# Patient Record
Sex: Female | Born: 2000 | Race: Black or African American | Hispanic: No | Marital: Single | Smoking: Never smoker
Health system: Southern US, Community
[De-identification: ages and names within clinical notes are randomized; demographics above are authoritative.]

## PROBLEM LIST (undated history)

## (undated) ENCOUNTER — Emergency Department (HOSPITAL_COMMUNITY): Admission: EM | Payer: Medicaid Other | Source: Home / Self Care

## (undated) DIAGNOSIS — F909 Attention-deficit hyperactivity disorder, unspecified type: Secondary | ICD-10-CM

## (undated) DIAGNOSIS — A6009 Herpesviral infection of other urogenital tract: Secondary | ICD-10-CM

## (undated) DIAGNOSIS — J302 Other seasonal allergic rhinitis: Secondary | ICD-10-CM

## (undated) HISTORY — DX: Herpesviral infection of other urogenital tract: A60.09

## (undated) HISTORY — PX: NO PAST SURGERIES: SHX2092

---
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
## 2020-10-03 ENCOUNTER — Other Ambulatory Visit: Payer: Self-pay

## 2020-10-03 ENCOUNTER — Encounter (HOSPITAL_COMMUNITY): Payer: Self-pay | Admitting: Emergency Medicine

## 2020-10-03 ENCOUNTER — Emergency Department (HOSPITAL_COMMUNITY)
Admission: EM | Admit: 2020-10-03 | Discharge: 2020-10-03 | Disposition: A | Discharge status: Home / Self Care | Payer: Medicaid Other | Attending: Emergency Medicine | Admitting: Emergency Medicine

## 2020-10-03 ENCOUNTER — Emergency Department (HOSPITAL_COMMUNITY): Payer: Medicaid Other

## 2020-10-03 DIAGNOSIS — Z3A01 Less than 8 weeks gestation of pregnancy: Secondary | ICD-10-CM | POA: Diagnosis not present

## 2020-10-03 DIAGNOSIS — O200 Threatened abortion: Secondary | ICD-10-CM | POA: Insufficient documentation

## 2020-10-03 DIAGNOSIS — O26891 Other specified pregnancy related conditions, first trimester: Secondary | ICD-10-CM | POA: Diagnosis present

## 2020-10-03 LAB — COMPREHENSIVE METABOLIC PANEL
ALT: 15 U/L (ref 0–44)
AST: 17 U/L (ref 15–41)
Albumin: 4.1 g/dL (ref 3.5–5.0)
Alkaline Phosphatase: 44 U/L (ref 38–126)
Anion gap: 9 (ref 5–15)
BUN: 10 mg/dL (ref 6–20)
CO2: 25 mmol/L (ref 22–32)
Calcium: 9.3 mg/dL (ref 8.9–10.3)
Chloride: 100 mmol/L (ref 98–111)
Creatinine, Ser: 0.59 mg/dL (ref 0.44–1.00)
GFR, Estimated: >60 mL/min (ref >60)
Glucose, Bld: 97 mg/dL (ref 70–99)
Potassium: 3.6 mmol/L (ref 3.5–5.1)
Sodium: 134 mmol/L — ABNORMAL LOW (ref 135–145)
Total Bilirubin: 0.5 mg/dL (ref 0.3–1.2)
Total Protein: 7.2 g/dL (ref 6.5–8.1)

## 2020-10-03 LAB — URINALYSIS, ROUTINE W REFLEX MICROSCOPIC
Bilirubin Urine: NEGATIVE
Glucose, UA: NEGATIVE mg/dL
Hgb urine dipstick: NEGATIVE
Ketones, ur: NEGATIVE mg/dL
Nitrite: NEGATIVE
Protein, ur: NEGATIVE mg/dL
Specific Gravity, Urine: 1.014 (ref 1.005–1.030)
pH: 6 (ref 5.0–8.0)

## 2020-10-03 LAB — CBC
HCT: 37.9 % (ref 36.0–46.0)
Hemoglobin: 12.6 g/dL (ref 12.0–15.0)
MCH: 29.4 pg (ref 26.0–34.0)
MCHC: 33.2 g/dL (ref 30.0–36.0)
MCV: 88.3 fL (ref 80.0–100.0)
Platelets: 289 10*3/uL (ref 150–400)
RBC: 4.29 MIL/uL (ref 3.87–5.11)
RDW: 13.2 % (ref 11.5–15.5)
WBC: 7.6 10*3/uL (ref 4.0–10.5)
nRBC: 0 % (ref 0.0–0.2)

## 2020-10-03 LAB — HCG, QUANTITATIVE, PREGNANCY: hCG, Beta Chain, Quant, S: 4715 m[IU]/mL — ABNORMAL HIGH (ref <5)

## 2020-10-03 LAB — LIPASE, BLOOD: Lipase: 32 U/L (ref 11–51)

## 2020-10-03 MED ORDER — PRENATAL VITAMINS 28-0.8 MG PO TABS: 1.0000 | ORAL_TABLET | Freq: Every day | ORAL | 0 refills | Status: DC

## 2020-10-03 NOTE — ED Provider Notes (Signed)
[facility]-EMERGENCY DEPT Provider Note: Lowella Dell, MD, FACEP  CSN: [id number] MRN: [id number] ARRIVAL: 10/03/20 at 0124 ROOM: WA15/[facility]15   CHIEF COMPLAINT  Abdominal Pain (pregnant)   HISTORY OF PRESENT ILLNESS  10/03/20 3:54 AM Andrea Vaughn is a 19 y.o. female who is G1, P0 of undetermined gestation.  She is here with abdominal cramping that began about an hour prior to arrival.  She rates the cramping is a 4 out of 10 in the upper abdomen.  Nothing makes it better or worse.  It has subsequently improved.  She denies vaginal bleeding.    Past Medical History:  Diagnosis Date  . ADHD (attention deficit hyperactivity disorder)   . Seasonal allergies     History reviewed. No pertinent surgical history.  No family history on file.  Social History   Tobacco Use  . Smoking status: Never Smoker  . Smokeless tobacco: Never Used  Substance Use Topics  . Alcohol use: Never  . Drug use: Never    Prior to Admission medications   Medication Sig Start Date End Date Taking? Authorizing Provider  Prenatal Vit-Fe Fumarate-FA (PRENATAL VITAMINS) 28-0.8 MG TABS Take 1 tablet by mouth daily. 10/03/20   Jef Futch, MD  cetirizine (ZYRTEC) 10 MG tablet Take 10 mg by mouth daily as needed for allergies.  10/03/20  [provider]  methylphenidate (METADATE CD) 10 MG CR capsule Take 10 mg by mouth every morning.  10/03/20  [provider]    Allergies Patient has no known allergies.   REVIEW OF SYSTEMS  Negative except as noted here or in the History of Present Illness.   PHYSICAL EXAMINATION  Initial Vital Signs Blood pressure (!) 116/96, pulse 94, resp. rate 18, SpO2 100 %.  Examination General: Well-developed, well-nourished female in no acute distress; appearance consistent with age of record HENT: normocephalic; atraumatic Eyes: pupils equal, round and reactive to light; extraocular muscles intact Neck: supple Heart: regular rate and rhythm Lungs:  clear to auscultation bilaterally Abdomen: soft; nondistended; nontender; no masses or hepatosplenomegaly; bowel sounds present Extremities: No deformity; full range of motion; pulses normal Neurologic: Awake, alert and oriented; motor function intact in all extremities and symmetric; no facial droop Skin: Warm and dry Psychiatric: Normal mood and affect   RESULTS  Summary of this visit's results, reviewed and interpreted by myself:   EKG Interpretation  Date/Time:    Ventricular Rate:    PR Interval:    QRS Duration:   QT Interval:    QTC Calculation:   R Axis:     Text Interpretation:        Laboratory Studies: Results for orders placed or performed during the hospital encounter of 10/03/20 (from the past 24 hour(s))  Lipase, blood     Status: None   Collection Time: 10/03/20  1:59 AM  Result Value Ref Range   Lipase 32 11 - 51 U/L  Comprehensive metabolic panel     Status: Abnormal   Collection Time: 10/03/20  1:59 AM  Result Value Ref Range   Sodium 134 (L) 135 - 145 mmol/L   Potassium 3.6 3.5 - 5.1 mmol/L   Chloride 100 98 - 111 mmol/L   CO2 25 22 - 32 mmol/L   Glucose, Bld 97 70 - 99 mg/dL   BUN 10 6 - 20 mg/dL   Creatinine, Ser 2.59 0.44 - 1.00 mg/dL   Calcium 9.3 8.9 - 56.3 mg/dL   Total Protein 7.2 6.5 - 8.1 g/dL   Albumin  4.1 3.5 - 5.0 g/dL   AST 17 15 - 41 U/L   ALT 15 0 - 44 U/L   Alkaline Phosphatase 44 38 - 126 U/L   Total Bilirubin 0.5 0.3 - 1.2 mg/dL   GFR, Estimated >56 >21 mL/min   Anion gap 9 5 - 15  CBC     Status: None   Collection Time: 10/03/20  1:59 AM  Result Value Ref Range   WBC 7.6 4.0 - 10.5 K/uL   RBC 4.29 3.87 - 5.11 MIL/uL   Hemoglobin 12.6 12.0 - 15.0 g/dL   HCT 30.8 36 - 46 %   MCV 88.3 80.0 - 100.0 fL   MCH 29.4 26.0 - 34.0 pg   MCHC 33.2 30.0 - 36.0 g/dL   RDW 65.7 84.6 - 96.2 %   Platelets 289 150 - 400 K/uL   nRBC 0.0 0.0 - 0.2 %  hCG, quantitative, pregnancy     Status: Abnormal   Collection Time: 10/03/20  2:10 AM    Result Value Ref Range   hCG, Beta Chain, Quant, S 4,715 (H) <5 mIU/mL  Urinalysis, Routine w reflex microscopic     Status: Abnormal   Collection Time: 10/03/20  3:58 AM  Result Value Ref Range   Color, Urine YELLOW YELLOW   APPearance CLOUDY (A) CLEAR   Specific Gravity, Urine 1.014 1.005 - 1.030   pH 6.0 5.0 - 8.0   Glucose, UA NEGATIVE NEGATIVE mg/dL   Hgb urine dipstick NEGATIVE NEGATIVE   Bilirubin Urine NEGATIVE NEGATIVE   Ketones, ur NEGATIVE NEGATIVE mg/dL   Protein, ur NEGATIVE NEGATIVE mg/dL   Nitrite NEGATIVE NEGATIVE   Leukocytes,Ua TRACE (A) NEGATIVE   RBC / HPF 0-5 0 - 5 RBC/hpf   WBC, UA 0-5 0 - 5 WBC/hpf   Bacteria, UA RARE (A) NONE SEEN   Squamous Epithelial / LPF 11-20 0 - 5   Mucus PRESENT    Imaging Studies: US OB Comp Less 14 Wks  Result Date: 10/03/2020 CLINICAL DATA:  Initial evaluation for acute abdominal pain, early pregnancy. EXAM: OBSTETRIC <14 WK ULTRASOUND TECHNIQUE: Transabdominal ultrasound was performed for evaluation of the gestation as well as the maternal uterus and adnexal regions. COMPARISON:  None. FINDINGS: Intrauterine gestational sac: Single Yolk sac:  Negative. Embryo:  Negative. Cardiac Activity: Negative. Heart Rate: N/A MSD:  9.6 mm   5 w   5 d Subchorionic hemorrhage:  None visualized. Maternal uterus/adnexae: Ovaries within normal limits bilaterally. Small corpus luteal cyst noted on the right. No adnexal mass or free fluid. IMPRESSION: 1. Probable early intrauterine gestational sac, but no yolk sac, fetal pole, or cardiac activity yet visualized. Recommend follow-up quantitative B-HCG levels and follow-up US in 14 days to confirm and assess viability. This recommendation follows SRU consensus guidelines: Diagnostic Criteria for Nonviable Pregnancy Early in the First Trimester. Malva Limes Med 2013; 952:8413-24. 2. No other acute maternal uterine or adnexal abnormality identified. Electronically Signed   By: Rise Mu M.D.   On:  10/03/2020 05:08    ED COURSE and MDM  Nursing notes, initial and subsequent vitals signs, including pulse oximetry, reviewed and interpreted by myself.  Vitals:   10/03/20 0349  BP: (!) 116/96  Pulse: 94  Resp: 18  SpO2: 100%   Medications - No data to display  We will refer patient to women's clinic.  PROCEDURES  Procedures   ED DIAGNOSES     ICD-10-CM   1. Threatened miscarriage in early pregnancy  O20.0        Marlaysia Lenig, Jonny Ruiz, MD 10/03/20 [phone]

## 2020-10-03 NOTE — ED Triage Notes (Signed)
Patient here from home reporting abd cramping that started 1 hour ago. Pregnant, not sure how far along. No bleeding. 1st baby.

## 2020-10-04 LAB — ABO/RH: ABO/RH(D): AB POS

## 2020-10-12 ENCOUNTER — Emergency Department (HOSPITAL_COMMUNITY)
Admission: EM | Admit: 2020-10-12 | Discharge: 2020-10-13 | Disposition: A | Discharge status: Home / Self Care | Payer: Medicaid Other | Attending: Emergency Medicine | Admitting: Emergency Medicine

## 2020-10-12 ENCOUNTER — Emergency Department (HOSPITAL_COMMUNITY): Payer: Medicaid Other

## 2020-10-12 ENCOUNTER — Other Ambulatory Visit: Payer: Self-pay

## 2020-10-12 ENCOUNTER — Encounter (HOSPITAL_COMMUNITY): Payer: Self-pay

## 2020-10-12 DIAGNOSIS — O4690 Antepartum hemorrhage, unspecified, unspecified trimester: Secondary | ICD-10-CM

## 2020-10-12 DIAGNOSIS — O039 Complete or unspecified spontaneous abortion without complication: Secondary | ICD-10-CM | POA: Insufficient documentation

## 2020-10-12 DIAGNOSIS — O26859 Spotting complicating pregnancy, unspecified trimester: Secondary | ICD-10-CM | POA: Diagnosis present

## 2020-10-12 LAB — HCG, QUANTITATIVE, PREGNANCY: hCG, Beta Chain, Quant, S: 6089 m[IU]/mL — ABNORMAL HIGH (ref <5)

## 2020-10-12 NOTE — ED Provider Notes (Addendum)
[facility]EM[facility] DEPT Provider Note   CSN: [id number] Arrival date & time: 10/12/20  1815     History Chief Complaint  Patient presents with  . Vaginal Bleeding    Andrea Vaughn is a 19 y.o. female.  The history is provided by the patient. No language interpreter was used.  Vaginal Bleeding Quality:  Spotting Severity:  Mild Timing:  Constant Progression:  Worsening Chronicity:  New Possible pregnancy: yes   Relieved by:  Nothing Worsened by:  Nothing Ineffective treatments:  None tried Associated symptoms: no dizziness        Past Medical History:  Diagnosis Date  . ADHD (attention deficit hyperactivity disorder)   . Seasonal allergies     There are no problems to display for this patient.   History reviewed. No pertinent surgical history.   OB History    Gravida  1   Para      Term      Preterm      AB      Living        SAB      TAB      Ectopic      Multiple      Live Births              History reviewed. No pertinent family history.  Social History   Tobacco Use  . Smoking status: Never Smoker  . Smokeless tobacco: Never Used  Vaping Use  . Vaping Use: Never used  Substance Use Topics  . Alcohol use: Never  . Drug use: Never    Home Medications Prior to Admission medications   Medication Sig Start Date End Date Taking? Authorizing Provider  Prenatal Vit-Fe Fumarate-FA (PRENATAL VITAMINS) 28-0.8 MG TABS Take 1 tablet by mouth daily. 10/03/20  Yes Molpus, John, MD  cetirizine (ZYRTEC) 10 MG tablet Take 10 mg by mouth daily as needed for allergies.  10/03/20  [provider]  methylphenidate (METADATE CD) 10 MG CR capsule Take 10 mg by mouth every morning.  10/03/20  [provider]    Allergies    Penicillins  Review of Systems   Review of Systems  Genitourinary: Positive for vaginal bleeding.  Neurological: Negative for dizziness.  All other systems reviewed and are  negative.   Physical Exam Updated Vital Signs BP 96/71   Pulse 82   Temp 98.8 F (37.1 C) (Oral)   Resp 18   Ht 5\' 2"  (1.575 m)   Wt 54.4 kg   SpO2 100%   BMI 21.95 kg/m   Physical Exam Vitals and nursing note reviewed.  Constitutional:      Appearance: She is well-developed.  HENT:     Head: Normocephalic.  Cardiovascular:     Rate and Rhythm: Normal rate.  Pulmonary:     Effort: Pulmonary effort is normal.  Abdominal:     General: There is no distension.     Tenderness: There is abdominal tenderness.  Genitourinary:    Comments: Small amount of red blood os closed Musculoskeletal:        General: Normal range of motion.     Cervical back: Normal range of motion.  Skin:    General: Skin is warm.  Neurological:     General: No focal deficit present.     Mental Status: She is alert and oriented to person, place, and time.  Psychiatric:        Mood and Affect: Mood normal.  ED Results / Procedures / Treatments   Labs (all labs ordered are listed, but only abnormal results are displayed) Labs Reviewed  HCG, QUANTITATIVE, PREGNANCY - Abnormal; Notable for the following components:      Result Value   hCG, Beta Chain, Quant, S 6,089 (*)    All other components within normal limits    EKG None  Radiology US OB Transvaginal  Result Date: 10/12/2020 CLINICAL DATA:  Bleeding, quantitative hCG 6089 EXAM: OBSTETRIC <14 WK ULTRASOUND TECHNIQUE: Transabdominal ultrasound was performed for evaluation of the gestation as well as the maternal uterus and adnexal regions. COMPARISON:  Ultrasound 10/03/2020 FINDINGS: Intrauterine gestational sac: Single Yolk sac:  Not Visualized. Embryo:  Not Visualized. Cardiac: Not Visualized. MSD:  10.9 mm   5 w   6 d Subchorionic hemorrhage: Small volume subchorionic hemorrhage of the presumed implantation site (5/41). Maternal uterus/adnexae: The anteverted maternal uterus is otherwise unremarkable. No concerning adnexal lesions.  Trace free fluid in the pelvis is nonspecific and may be physiologic. IMPRESSION: Gestational sac with absence of a discernible fetal pole or cardiac activity 9 days after prior study demonstrating a probable gestational sac. Small volume subchorionic hemorrhage is also now seen adjacent the implantation site. Findings are suspicious but not yet definitive for failed pregnancy. Recommend follow-up US in 10-14 days for definitive diagnosis. This recommendation follows SRU consensus guidelines: Diagnostic Criteria for Nonviable Pregnancy Early in the First Trimester. Malva Limes Med 2013; 161:0960-45. Electronically Signed   By: Kreg Shropshire M.D.   On: 10/12/2020 23:39    Procedures Procedures (including critical care time)  Medications Ordered in ED Medications - No data to display  ED Course  I have reviewed the triage vital signs and the nursing notes.  Pertinent labs & imaging results that were available during my care of the patient were reviewed by me and considered in my medical decision making (see chart for details).    MDM Rules/Calculators/A&P                          MDM:  Pt advised to schedule to see OB/gyn for recheck.   Final Clinical Impression(s) / ED Diagnoses Final diagnoses:  Vaginal bleeding in pregnancy    Rx / DC Orders ED Discharge Orders    None    An After Visit Summary was printed and given to the patient.    Elson Areas, PA-C 10/12/20 [id number]    Elson Areas, PA-C 10/13/20 [name]    Arby Barrette, MD 10/23/20 [phone]

## 2020-10-12 NOTE — ED Triage Notes (Signed)
Patient reports that she had a small amount of pink drainage today and states she is pregnant. Patient has not gone for an OB/GYN appointment.  patient also c/o lower abdominal cramping. Patient states she was here 9 days ago and had abdominal cramping then .

## 2020-10-12 NOTE — ED Notes (Signed)
Pt states she is pregnant , has a pink colored discharge, lower abd. Pain and is pregnant

## 2020-10-12 NOTE — Discharge Instructions (Addendum)
Schedule evaluation with obgyn.  Your pregnancy does not appear to be progressing normally.  Go to [facility] unit at [facility] if symptoms worsen or change

## 2020-10-13 ENCOUNTER — Telehealth: Payer: Self-pay | Admitting: Advanced Practice Midwife

## 2020-10-13 ENCOUNTER — Inpatient Hospital Stay (HOSPITAL_COMMUNITY): Payer: Medicaid Other

## 2020-10-13 ENCOUNTER — Telehealth: Payer: Self-pay

## 2020-10-13 ENCOUNTER — Encounter (HOSPITAL_COMMUNITY): Payer: Self-pay | Admitting: Obstetrics & Gynecology

## 2020-10-13 ENCOUNTER — Inpatient Hospital Stay (EMERGENCY_DEPARTMENT_HOSPITAL)
Admission: AD | Admit: 2020-10-13 | Discharge: 2020-10-13 | Disposition: A | Discharge status: Home / Self Care | Payer: Medicaid Other | Source: Home / Self Care | Attending: Obstetrics & Gynecology | Admitting: Obstetrics & Gynecology

## 2020-10-13 DIAGNOSIS — O4690 Antepartum hemorrhage, unspecified, unspecified trimester: Secondary | ICD-10-CM

## 2020-10-13 DIAGNOSIS — O039 Complete or unspecified spontaneous abortion without complication: Secondary | ICD-10-CM | POA: Insufficient documentation

## 2020-10-13 DIAGNOSIS — O209 Hemorrhage in early pregnancy, unspecified: Secondary | ICD-10-CM

## 2020-10-13 DIAGNOSIS — Z3A01 Less than 8 weeks gestation of pregnancy: Secondary | ICD-10-CM | POA: Insufficient documentation

## 2020-10-13 LAB — CBC
HCT: 39 % (ref 36.0–46.0)
Hemoglobin: 12.8 g/dL (ref 12.0–15.0)
MCH: 28.4 pg (ref 26.0–34.0)
MCHC: 32.8 g/dL (ref 30.0–36.0)
MCV: 86.7 fL (ref 80.0–100.0)
Platelets: 297 10*3/uL (ref 150–400)
RBC: 4.5 MIL/uL (ref 3.87–5.11)
RDW: 12.9 % (ref 11.5–15.5)
WBC: 4.2 10*3/uL (ref 4.0–10.5)
nRBC: 0 % (ref 0.0–0.2)

## 2020-10-13 LAB — COMPREHENSIVE METABOLIC PANEL
ALT: 19 U/L (ref 0–44)
AST: 22 U/L (ref 15–41)
Albumin: 3.9 g/dL (ref 3.5–5.0)
Alkaline Phosphatase: 38 U/L (ref 38–126)
Anion gap: 9 (ref 5–15)
BUN: 5 mg/dL — ABNORMAL LOW (ref 6–20)
CO2: 27 mmol/L (ref 22–32)
Calcium: 9.3 mg/dL (ref 8.9–10.3)
Chloride: 104 mmol/L (ref 98–111)
Creatinine, Ser: 0.75 mg/dL (ref 0.44–1.00)
GFR, Estimated: >60 mL/min (ref >60)
Glucose, Bld: 105 mg/dL — ABNORMAL HIGH (ref 70–99)
Potassium: 3.8 mmol/L (ref 3.5–5.1)
Sodium: 140 mmol/L (ref 135–145)
Total Bilirubin: 0.6 mg/dL (ref 0.3–1.2)
Total Protein: 7.3 g/dL (ref 6.5–8.1)

## 2020-10-13 LAB — HCG, QUANTITATIVE, PREGNANCY: hCG, Beta Chain, Quant, S: 6060 m[IU]/mL — ABNORMAL HIGH (ref <5)

## 2020-10-13 LAB — WET PREP, GENITAL
Sperm: NONE SEEN
Trich, Wet Prep: NONE SEEN
Yeast Wet Prep HPF POC: NONE SEEN

## 2020-10-13 MED ORDER — OXYCODONE-ACETAMINOPHEN 5-325 MG PO TABS
1.0000 | ORAL_TABLET | ORAL | 0 refills | Status: DC | PRN
Start: 2020-10-13 — End: 2020-10-20

## 2020-10-13 MED ORDER — MISOPROSTOL 200 MCG PO TABS
800.0000 ug | ORAL_TABLET | Freq: Once | ORAL | Status: AC
  Administered 2020-10-13: 800 ug via VAGINAL
  Filled 2020-10-13: qty 4

## 2020-10-13 MED ORDER — PROMETHAZINE HCL 12.5 MG PO TABS: 12.5000 mg | ORAL_TABLET | Freq: Four times a day (QID) | ORAL | 0 refills | Status: DC | PRN

## 2020-10-13 NOTE — Telephone Encounter (Signed)
Orders have been placed.

## 2020-10-13 NOTE — Discharge Instructions (Signed)
Miscarriage A miscarriage is the loss of an unborn baby (fetus) before the 20th week of pregnancy. Follow these instructions at home: Medicines   Take over-the-counter and prescription medicines only as told by your doctor.  If you were prescribed antibiotic medicine, take it as told by your doctor. Do not stop taking the antibiotic even if you start to feel better.  Do not take NSAIDs unless your doctor says that this is safe for you. NSAIDs include aspirin and ibuprofen. These medicines can cause bleeding. Activity  Rest as directed. Ask your doctor what activities are safe for you.  Have someone help you at home during this time. General instructions  Write down how many pads you use each day and how soaked they are.  Watch the amount of tissue or clumps of blood (blood clots) that you pass from your vagina. Save any large amounts of tissue for your doctor.  Do not use tampons, douche, or have sex until your doctor approves.  To help you and your partner with the process of grieving, talk with your doctor or seek counseling.  When you are ready, meet with your doctor to talk about steps you should take for your health. Also, talk with your doctor about steps to take to have a healthy pregnancy in the future.  Keep all follow-up visits as told by your doctor. This is important. Contact a doctor if:  You have a fever or chills.  You have vaginal discharge that smells bad.  You have more bleeding. Get help right away if:  You have very bad cramps or pain in your back or belly.  You pass clumps of blood that are walnut-sized or larger from your vagina.  You pass tissue that is walnut-sized or larger from your vagina.  You soak more than 1 regular pad in an hour.  You get light-headed or weak.  You faint (pass out).  You have feelings of sadness that do not go away, or you have thoughts of hurting yourself. Summary  A miscarriage is the loss of an unborn baby before  the 20th week of pregnancy.  Follow your doctor's instructions for home care. Keep all follow-up appointments.  To help you and your partner with the process of grieving, talk with your doctor or seek counseling. This information is not intended to replace advice given to you by your health care provider. Make sure you discuss any questions you have with your health care provider. Document Revised: 02/22/2019 Document Reviewed: 12/06/2016 Elsevier Patient Education  2020 Elsevier Inc.   Managing Pregnancy Loss Pregnancy loss can happen any time during a pregnancy. Often the cause is not known. It is rarely because of anything you did. Pregnancy loss in early pregnancy (during the first trimester) is called a miscarriage. This type of pregnancy loss is the most common. Pregnancy loss that happens after 20 weeks of pregnancy is called fetal demise if the baby's heart stops beating before birth. Fetal demise is much less common. Some women experience spontaneous labor shortly after fetal demise resulting in a stillborn birth (stillbirth). Any pregnancy loss can be devastating. You will need to recover both physically and emotionally. Most women are able to get pregnant again after a pregnancy loss and deliver a healthy baby. How to manage emotional recovery  Pregnancy loss is very hard emotionally. You may feel many different emotions while you grieve. You may feel sad and angry. You may also feel guilty. It is normal to have periods of crying.   Emotional recovery can take longer than physical recovery. It is different for everyone. Taking these steps can help you in managing this loss:  Remember that it is unlikely you did anything to cause the pregnancy loss.  Share your thoughts and feelings with friends, family, and your partner. Remember that your partner is also recovering emotionally.  Make sure you have a good support system. Do not spend too much time alone.  Meet with a pregnancy loss  counselor or join a pregnancy loss support group.  Get enough sleep and eat a healthy diet. Return to regular exercise when you have recovered physically.  Do not use drugs or alcohol to manage your emotions.  Consider seeing a mental health professional to help you recover emotionally.  Ask a friend or loved one to help you decide what to do with any clothing and nursery items you received for your baby. In the case of a stillbirth, many women benefit from taking additional steps in the grieving process. You may want to:  Hold your baby after the birth.  Name your baby.  Request a birth certificate.  Create a keepsake such as handprints or footprints.  Dress your baby and have a picture taken.  Make funeral arrangements.  Ask for a baptism or blessing. Hospitals have staff members who can help you with all these arrangements. How to recognize emotional stress It is normal to have emotional stress after a pregnancy loss. But emotional stress that lasts a long time or becomes severe requires treatment. Watch out for these signs of severe emotional stress:  Sadness, anger, or guilt that is not going away and is interfering with your normal activities.  Relationship problems that have occurred or gotten worse since the pregnancy loss.  Signs of depression that last longer than 2 weeks. These may include: ? Sadness. ? Anxiety. ? Hopelessness. ? Loss of interest in activities you enjoy. ? Inability to concentrate. ? Trouble sleeping or sleeping too much. ? Loss of appetite or overeating. ? Thoughts of death or of hurting yourself. Follow these instructions at home:  Take over-the-counter and prescription medicines only as told by your health care provider.  Rest at home until your energy level returns. Return to your normal activities as told by your health care provider. Ask your health care provider what activities are safe for you.  When you are ready, meet with your  health care provider to discuss steps to take for a future pregnancy.  Keep all follow-up visits as told by your health care provider. This is important. Where to find support  To help you and your partner with the process of grieving, talk with your health care provider or seek counseling.  Consider meeting with others who have experienced pregnancy loss. Ask your health care provider about support groups and resources. Where to find more information  [place] Department of Health and Human Services Office on Women's Health: www.womenshealth.gov  American Pregnancy Association: www.americanpregnancy.org Contact a health care provider if:  You continue to experience grief, sadness, or lack of motivation for everyday activities, and those feelings do not improve over time.  You are struggling to recover emotionally, especially if you are using alcohol or substances to help. Get help right away if:  You have thoughts of hurting yourself or others. If you ever feel like you may hurt yourself or others, or have thoughts about taking your own life, get help right away. You can go to your nearest emergency department or call:    Your local emergency services (911 in the [place]  A suicide crisis helpline, such as the National Suicide Prevention Lifeline at 1-800-273-8255. This is open 24 hours a day. Summary  Any pregnancy loss can be difficult physically and emotionally.  You may experience many different emotions while you grieve. Emotional recovery can last longer than physical recovery.  It is normal to have emotional stress after a pregnancy loss. But emotional stress that lasts a long time or becomes severe requires treatment.  See your health care provider if you are struggling emotionally after a pregnancy loss. This information is not intended to replace advice given to you by your health care provider. Make sure you discuss any questions you have with your health care  provider. Document Revised: 02/20/2019 Document Reviewed: 01/11/2018 Elsevier Patient Education  2020 Elsevier Inc.  

## 2020-10-13 NOTE — Telephone Encounter (Signed)
Pt called [facility] office today, directed by [facility] to follow up with OB/Gyn. On further review of her chart, with inappropriate rise in hcg levels and [place] without clear indication of IUP, consulted Dr Mayford Knife. Per Dr Mayford Knife, pt to go to MAU today for [place] and to continue workup for ectopic.    Pt reports bleeding has increased today, since her ED visit yesterday, and she had pain last night but none today.  Discussed need for evaluation for ectopic pregnancy and answered pt questions.  She reports she will head to MAU today to further her evaluation.

## 2020-10-13 NOTE — MAU Provider Note (Signed)
Chief Complaint:  Vaginal Bleeding and Abdominal Pain   None    HPI: Andrea Vaughn is a 19 y.o. G1P0 at 8889w1d who presents to maternity admissions reporting heavier vaginal bleeding. She has had an ectopic workup twice in the past 10 days [facility] including one yesterday. She was having cramping and spotting but the bleeding got much heavier today. Her first u/s showed a gestational sac, the 2nd showed a IUGS/YS and small SCH. Pt is concerned about possible miscarriage.   Past Medical History:  Diagnosis Date  . ADHD (attention deficit hyperactivity disorder)   . Seasonal allergies    OB History  Gravida Para Term Preterm AB Living  1            SAB TAB Ectopic Multiple Live Births               # Outcome Date GA Lbr Len/2nd Weight Sex Delivery Anes PTL Lv  1 Current            Past Surgical History:  Procedure Laterality Date  . NO PAST SURGERIES     Family History  Problem Relation Age of Onset  . Anemia Mother    Social History   Tobacco Use  . Smoking status: Never Smoker  . Smokeless tobacco: Never Used  Vaping Use  . Vaping Use: Former  Substance Use Topics  . Alcohol use: Not Currently  . Drug use: Yes    Comment: October 2021   Allergies  Allergen Reactions  . Penicillins Rash    " antibiotic" mom unsure of med.  Fine rash with Amoxicillin    Medications Prior to Admission  Medication Sig Dispense Refill Last Dose  . Prenatal Vit-Fe Fumarate-FA (PRENATAL VITAMINS) 28-0.8 MG TABS Take 1 tablet by mouth daily. 90 tablet 0 10/13/2020 at 1000    I have reviewed patient's Past Medical Hx, Surgical Hx, Family Hx, Social Hx, medications and allergies.   ROS:  Review of Systems  Constitutional: Negative for fatigue and fever.  HENT: Negative for congestion.   Respiratory: Negative for cough and shortness of breath.   Gastrointestinal: Positive for abdominal pain. Negative for nausea and vomiting.  Genitourinary: Positive for vaginal bleeding.   Neurological: Negative for dizziness, syncope and headaches.  All other systems reviewed and are negative.   Physical Exam   Patient Vitals for the past 24 hrs:  BP Temp Temp src Pulse Resp SpO2 Height Weight  10/13/20 2106 118/63 99.1 F (37.3 C) Oral -- 15 -- -- --  10/13/20 1906 106/60 -- -- -- -- -- -- --  10/13/20 1825 112/62 98.7 F (37.1 C) Oral 75 18 100 % -- --  10/13/20 1439 102/60 99.1 F (37.3 C) Oral 96 16 100 % 5\' 2"  (1.575 m) 115 lb 8 oz (52.4 kg)   Constitutional: Well-developed, well-nourished female in no acute distress.  Cardiovascular: normal rate & rhythm, no murmur Respiratory: normal effort, lung sounds clear throughout GI: Abd soft, non-tender, gravid appropriate for gestational age. Pos BS x 4 MS: Extremities nontender, no edema, normal ROM Neurologic: Alert and oriented x 4.  GU: no CVA tenderness      Pelvic exam deferred, blind swabs obtained  Labs: Results for orders placed or performed during the hospital encounter of 10/13/20 (from the past 24 hour(s))  CBC     Status: None   Collection Time: 10/13/20  2:55 PM  Result Value Ref Range   WBC 4.2 4.0 - 10.5 K/uL  RBC 4.50 3.87 - 5.11 MIL/uL   Hemoglobin 12.8 12.0 - 15.0 g/dL   HCT 11.5 36 - 46 %   MCV 86.7 80.0 - 100.0 fL   MCH 28.4 26.0 - 34.0 pg   MCHC 32.8 30.0 - 36.0 g/dL   RDW 72.6 20.3 - 55.9 %   Platelets 297 150 - 400 K/uL   nRBC 0.0 0.0 - 0.2 %  Comprehensive metabolic panel     Status: Abnormal   Collection Time: 10/13/20  2:55 PM  Result Value Ref Range   Sodium 140 135 - 145 mmol/L   Potassium 3.8 3.5 - 5.1 mmol/L   Chloride 104 98 - 111 mmol/L   CO2 27 22 - 32 mmol/L   Glucose, Bld 105 (H) 70 - 99 mg/dL   BUN 5 (L) 6 - 20 mg/dL   Creatinine, Ser 7.41 0.44 - 1.00 mg/dL   Calcium 9.3 8.9 - 63.8 mg/dL   Total Protein 7.3 6.5 - 8.1 g/dL   Albumin 3.9 3.5 - 5.0 g/dL   AST 22 15 - 41 U/L   ALT 19 0 - 44 U/L   Alkaline Phosphatase 38 38 - 126 U/L   Total Bilirubin 0.6 0.3 -  1.2 mg/dL   GFR, Estimated >45 >36 mL/min   Anion gap 9 5 - 15  hCG, quantitative, pregnancy     Status: Abnormal   Collection Time: 10/13/20  2:55 PM  Result Value Ref Range   hCG, Beta Chain, Quant, S 6,060 (H) <5 mIU/mL  Wet prep, genital     Status: Abnormal   Collection Time: 10/13/20  6:20 PM  Result Value Ref Range   Yeast Wet Prep HPF POC NONE SEEN NONE SEEN   Trich, Wet Prep NONE SEEN NONE SEEN   Clue Cells Wet Prep HPF POC PRESENT (A) NONE SEEN   WBC, Wet Prep HPF POC MODERATE (A) NONE SEEN   Sperm NONE SEEN    Imaging:  US OB Transvaginal  Result Date: 10/13/2020 CLINICAL DATA:  Vaginal bleeding since 11/28 with cramping since 11/20 EXAM: TRANSVAGINAL OB ULTRASOUND TECHNIQUE: Transvaginal ultrasound was performed for complete evaluation of the gestation as well as the maternal uterus, adnexal regions, and pelvic cul-de-sac. COMPARISON:  Obstetrical ultrasound 10/12/2020 FINDINGS: Intrauterine gestational sac: Presumed gestational sac is irregularly shaped and now positioned within the endometrium towards the lower uterine segment, previously located at the fundus. Yolk sac: Small echogenic structure, incompletely visualized may reflect a yolk sac. Embryo:  Not Visualized. Cardiac Activity: Not Visualized. MSD: 14 mm   6 w   2 d Subchorionic hemorrhage: Previously seen site of presumed subchorionic hemorrhage is not well visualized. Maternal uterus/adnexae: Anteverted maternal uterus is otherwise unremarkable. No concerning adnexal lesions. No free fluid. IMPRESSION: Redemonstration of an irregular gestational sac which now appears positioned within the endometrium of the lower uterine segment concerning for possible abortion in progress progressing towards the cervix. A possible yolk sac is visualized but without discernible fetal pole or cardiac activity. Consider short-term follow-up ultrasound in 7-10 days and serial beta HCG measurement. Electronically Signed   By: Kreg Shropshire  M.D.   On: 10/13/2020 19:21   US OB Transvaginal  Result Date: 10/12/2020 CLINICAL DATA:  Bleeding, quantitative hCG 6089 EXAM: OBSTETRIC <14 WK ULTRASOUND TECHNIQUE: Transabdominal ultrasound was performed for evaluation of the gestation as well as the maternal uterus and adnexal regions. COMPARISON:  Ultrasound 10/03/2020 FINDINGS: Intrauterine gestational sac: Single Yolk sac:  Not Visualized. Embryo:  Not Visualized.  Cardiac: Not Visualized. MSD:  10.9 mm   5 w   6 d Subchorionic hemorrhage: Small volume subchorionic hemorrhage of the presumed implantation site (5/41). Maternal uterus/adnexae: The anteverted maternal uterus is otherwise unremarkable. No concerning adnexal lesions. Trace free fluid in the pelvis is nonspecific and may be physiologic. IMPRESSION: Gestational sac with absence of a discernible fetal pole or cardiac activity 9 days after prior study demonstrating a probable gestational sac. Small volume subchorionic hemorrhage is also now seen adjacent the implantation site. Findings are suspicious but not yet definitive for failed pregnancy. Recommend follow-up US in 10-14 days for definitive diagnosis. This recommendation follows SRU consensus guidelines: Diagnostic Criteria for Nonviable Pregnancy Early in the First Trimester. Malva Limes Med 2013; 742:5956-38. Electronically Signed   By: Kreg Shropshire M.D.   On: 10/12/2020 23:39   MAU Course: Orders Placed This Encounter  Procedures  . Wet prep, genital  . US OB Transvaginal  . CBC  . Comprehensive metabolic panel  . hCG, quantitative, pregnancy  . Discharge patient   Meds ordered this encounter  Medications  . misoprostol (CYTOTEC) tablet 800 mcg  . promethazine (PHENERGAN) 12.5 MG tablet    Sig: Take 1 tablet (12.5 mg total) by mouth every 6 (six) hours as needed for nausea or vomiting.    Dispense:  30 tablet    Refill:  0    Order Specific Question:   Supervising Provider    Answer:   Reva Bores [[id number]]  .  oxyCODONE-acetaminophen (PERCOCET/ROXICET) 5-325 MG tablet    Sig: Take 1 tablet by mouth every 4 (four) hours as needed for severe pain.    Dispense:  20 tablet    Refill:  0    Order Specific Question:   Supervising Provider    Answer:   Samara Snide    MDM: Consulted Dr. Raynelle Dick after U/S resulted, he deemed pt appropriate for expectant management of failed pregnancy or cytotec (whichever route preferred by pt).  Discussed u/s report with patient and discussed options. Gave r/b of expectant management vs cytotec, pt prefers cytotec. Gave anticipatory guidance about expected course of cytotec therapy and bleeding precautions.  Early Intrauterine Pregnancy Failure X Documented intrauterine pregnancy failure less than or equal to [redacted] weeks gestation X No serious current illness X Baseline Hgb greater than or equal to 10g/dl (75.6) X Patient has easily accessible transportation to the hospital X Clear preference X Practitioner/physician deems patient reliable X Counseling by practitioner or physician X Patient education by RN X Consent form signed X Rho-Gam not indicated X Cytotec 800 mcg intravaginally by Erle Crocker, RN in MAU (tolerated well) X Ibuprofen 600 mg 1 tablet by mouth every 6 hours as needed #30 X Hydrocodone/acetaminophen 5/325 mg by mouth every 4 to 6 hours as needed X Phenergan 12.5 mg by mouth every 4 hours as needed for nausea  Assessment: 1. Miscarriage   2. Vaginal bleeding in pregnancy    Plan: Discharge home in stable condition with bleeding precautions.  Message sent to [facility] for SAB f/u scheduling   Follow-up Information    [facility]. Go in 1 week(s).   Specialty: Obstetrics and Gynecology Why: for follow up blood work and two weeks for an appointment with a provider Contact information: [address] [place] [address] [phone]              Allergies as  of 10/13/2020  Reactions   Penicillins Rash   " antibiotic" mom unsure of med.  Fine rash with Amoxicillin      Medication List    TAKE these medications   oxyCODONE-acetaminophen 5-325 MG tablet Commonly known as: PERCOCET/ROXICET Take 1 tablet by mouth every 4 (four) hours as needed for severe pain.   Prenatal Vitamins 28-0.8 MG Tabs Take 1 tablet by mouth daily.   promethazine 12.5 MG tablet Commonly known as: PHENERGAN Take 1 tablet (12.5 mg total) by mouth every 6 (six) hours as needed for nausea or vomiting.      Edd Arbour, CNM, MSN, IBCLC 10/13/20 9:22 PM

## 2020-10-13 NOTE — MAU Note (Signed)
Is pregnant and is bleeding like a period.  Had like a spot on 11/28,  Went in for cramping on 11/20, went in yesterday for dk spotting.  Passed a small clot when she got home. Since then has gotten heavier. Having a litte cramping, that comes and goes, feels like period is going to start. Was told to call today to be seen

## 2020-10-13 NOTE — Progress Notes (Signed)
Pt given instructions regarding self swabbing for GC/Chlamydia cultures & wet prep.  Pt verbalized understanding and will self swab

## 2020-10-14 LAB — GC/CHLAMYDIA PROBE AMP (~~LOC~~) NOT AT ARMC
Chlamydia: NEGATIVE
Comment: NEGATIVE
Comment: NORMAL
Neisseria Gonorrhea: NEGATIVE

## 2020-10-20 ENCOUNTER — Other Ambulatory Visit: Payer: Medicaid Other

## 2020-10-20 ENCOUNTER — Encounter (HOSPITAL_COMMUNITY): Payer: Self-pay | Admitting: Obstetrics and Gynecology

## 2020-10-20 ENCOUNTER — Other Ambulatory Visit: Payer: Self-pay

## 2020-10-20 ENCOUNTER — Inpatient Hospital Stay (HOSPITAL_COMMUNITY)
Admission: AD | Admit: 2020-10-20 | Discharge: 2020-10-20 | Disposition: A | Discharge status: Home / Self Care | Payer: Medicaid Other | Attending: Obstetrics and Gynecology | Admitting: Obstetrics and Gynecology

## 2020-10-20 DIAGNOSIS — O039 Complete or unspecified spontaneous abortion without complication: Secondary | ICD-10-CM | POA: Insufficient documentation

## 2020-10-20 DIAGNOSIS — R102 Pelvic and perineal pain: Secondary | ICD-10-CM

## 2020-10-20 LAB — CBC
HCT: 33.6 % — ABNORMAL LOW (ref 36.0–46.0)
Hemoglobin: 11.5 g/dL — ABNORMAL LOW (ref 12.0–15.0)
MCH: 29.5 pg (ref 26.0–34.0)
MCHC: 34.2 g/dL (ref 30.0–36.0)
MCV: 86.2 fL (ref 80.0–100.0)
Platelets: 273 10*3/uL (ref 150–400)
RBC: 3.9 MIL/uL (ref 3.87–5.11)
RDW: 13.4 % (ref 11.5–15.5)
WBC: 5.8 10*3/uL (ref 4.0–10.5)
nRBC: 0 % (ref 0.0–0.2)

## 2020-10-20 LAB — HCG, QUANTITATIVE, PREGNANCY: hCG, Beta Chain, Quant, S: 2999 m[IU]/mL — ABNORMAL HIGH (ref <5)

## 2020-10-20 MED ORDER — IBUPROFEN 800 MG PO TABS: 800.0000 mg | ORAL_TABLET | Freq: Three times a day (TID) | ORAL | 1 refills | Status: DC | PRN

## 2020-10-20 MED ORDER — IBUPROFEN 800 MG PO TABS
800.0000 mg | ORAL_TABLET | Freq: Once | ORAL | Status: AC
  Administered 2020-10-20: 800 mg via ORAL
  Filled 2020-10-20: qty 1

## 2020-10-20 NOTE — Discharge Instructions (Signed)
Miscarriage A miscarriage is the loss of an unborn baby (fetus) before the 20th week of pregnancy. Follow these instructions at home: Medicines   Take over-the-counter and prescription medicines only as told by your doctor.  If you were prescribed antibiotic medicine, take it as told by your doctor. Do not stop taking the antibiotic even if you start to feel better.  Do not take NSAIDs unless your doctor says that this is safe for you. NSAIDs include aspirin and ibuprofen. These medicines can cause bleeding. Activity  Rest as directed. Ask your doctor what activities are safe for you.  Have someone help you at home during this time. General instructions  Write down how many pads you use each day and how soaked they are.  Watch the amount of tissue or clumps of blood (blood clots) that you pass from your vagina. Save any large amounts of tissue for your doctor.  Do not use tampons, douche, or have sex until your doctor approves.  To help you and your partner with the process of grieving, talk with your doctor or seek counseling.  When you are ready, meet with your doctor to talk about steps you should take for your health. Also, talk with your doctor about steps to take to have a healthy pregnancy in the future.  Keep all follow-up visits as told by your doctor. This is important. Contact a doctor if:  You have a fever or chills.  You have vaginal discharge that smells bad.  You have more bleeding. Get help right away if:  You have very bad cramps or pain in your back or belly.  You pass clumps of blood that are walnut-sized or larger from your vagina.  You pass tissue that is walnut-sized or larger from your vagina.  You soak more than 1 regular pad in an hour.  You get light-headed or weak.  You faint (pass out).  You have feelings of sadness that do not go away, or you have thoughts of hurting yourself. Summary  A miscarriage is the loss of an unborn baby before  the 20th week of pregnancy.  Follow your doctor's instructions for home care. Keep all follow-up appointments.  To help you and your partner with the process of grieving, talk with your doctor or seek counseling. This information is not intended to replace advice given to you by your health care provider. Make sure you discuss any questions you have with your health care provider. Document Revised: 02/22/2019 Document Reviewed: 12/06/2016 Elsevier Patient Education  2020 Elsevier Inc.  

## 2020-10-20 NOTE — MAU Note (Signed)
Was here about a wk ago, was given vag cytotec.  Is still heavy, but not soaking pad and small clots. Still cramping, the medication she was given for pain, didn't really help- just made her throw up. Was to come back in a wk for f/u.

## 2020-10-27 ENCOUNTER — Ambulatory Visit: Payer: Medicaid Other | Admitting: Advanced Practice Midwife

## 2020-11-05 ENCOUNTER — Inpatient Hospital Stay (HOSPITAL_COMMUNITY)
Admission: AD | Admit: 2020-11-05 | Discharge: 2020-11-06 | Disposition: A | Discharge status: Home / Self Care | Payer: Medicaid Other | Attending: Obstetrics & Gynecology | Admitting: Obstetrics & Gynecology

## 2020-11-05 DIAGNOSIS — O034 Incomplete spontaneous abortion without complication: Secondary | ICD-10-CM

## 2020-11-05 DIAGNOSIS — Z3A10 10 weeks gestation of pregnancy: Secondary | ICD-10-CM | POA: Insufficient documentation

## 2020-11-05 DIAGNOSIS — Z880 Allergy status to penicillin: Secondary | ICD-10-CM | POA: Insufficient documentation

## 2020-11-05 NOTE — MAU Note (Signed)
Was seen 3-4 wks ago for SAB. Given pills to help clean everything out. I have bled some since then but today my bleeding is heavy. Soaking pads and passing hugh clots. No pain

## 2020-11-06 ENCOUNTER — Encounter (HOSPITAL_COMMUNITY): Payer: Self-pay | Admitting: Obstetrics & Gynecology

## 2020-11-06 ENCOUNTER — Inpatient Hospital Stay (HOSPITAL_COMMUNITY): Payer: Medicaid Other

## 2020-11-06 DIAGNOSIS — O031 Delayed or excessive hemorrhage following incomplete spontaneous abortion: Secondary | ICD-10-CM

## 2020-11-06 DIAGNOSIS — Z880 Allergy status to penicillin: Secondary | ICD-10-CM | POA: Diagnosis not present

## 2020-11-06 DIAGNOSIS — O034 Incomplete spontaneous abortion without complication: Secondary | ICD-10-CM | POA: Diagnosis not present

## 2020-11-06 DIAGNOSIS — Z3A10 10 weeks gestation of pregnancy: Secondary | ICD-10-CM | POA: Diagnosis not present

## 2020-11-06 DIAGNOSIS — Z3A00 Weeks of gestation of pregnancy not specified: Secondary | ICD-10-CM

## 2020-11-06 LAB — CBC
HCT: 33.4 % — ABNORMAL LOW (ref 36.0–46.0)
Hemoglobin: 11.3 g/dL — ABNORMAL LOW (ref 12.0–15.0)
MCH: 29.4 pg (ref 26.0–34.0)
MCHC: 33.8 g/dL (ref 30.0–36.0)
MCV: 86.8 fL (ref 80.0–100.0)
Platelets: 280 10*3/uL (ref 150–400)
RBC: 3.85 MIL/uL — ABNORMAL LOW (ref 3.87–5.11)
RDW: 13.7 % (ref 11.5–15.5)
WBC: 5.1 10*3/uL (ref 4.0–10.5)
nRBC: 0 % (ref 0.0–0.2)

## 2020-11-06 LAB — TYPE AND SCREEN
ABO/RH(D): AB POS
Antibody Screen: NEGATIVE

## 2020-11-06 LAB — HCG, QUANTITATIVE, PREGNANCY: hCG, Beta Chain, Quant, S: 58 m[IU]/mL — ABNORMAL HIGH (ref <5)

## 2020-11-06 MED ORDER — METHYLERGONOVINE MALEATE 0.2 MG/ML IJ SOLN
0.2000 mg | Freq: Once | INTRAMUSCULAR | Status: AC
  Administered 2020-11-06: 06:00:00 0.2 mg via INTRAMUSCULAR
  Filled 2020-11-06: qty 1

## 2020-11-06 MED ORDER — SODIUM CHLORIDE 0.9 % IV SOLN
100.0000 mg | Freq: Once | INTRAVENOUS | Status: AC
  Administered 2020-11-06: 06:00:00 100 mg via INTRAVENOUS
  Filled 2020-11-06: qty 100

## 2020-11-06 MED ORDER — STERILE WATER FOR INJECTION IJ SOLN: 200.0000 mg | Freq: Once | INTRAVENOUS | Status: DC

## 2020-11-06 MED ORDER — DOXYCYCLINE HYCLATE 100 MG IV SOLR
200.0000 mg | Freq: Once | INTRAVENOUS | Status: AC
  Administered 2020-11-06: 02:00:00 200 mg via INTRAVENOUS
  Filled 2020-11-06: qty 200

## 2020-11-06 MED ORDER — HYDROMORPHONE HCL 1 MG/ML IJ SOLN
1.0000 mg | Freq: Once | INTRAMUSCULAR | Status: AC
  Administered 2020-11-06: 05:00:00 1 mg via INTRAVENOUS
  Filled 2020-11-06: qty 1

## 2020-11-06 MED ORDER — LIDOCAINE HCL (PF) 1 % IJ SOLN: 10.0000 mL | Freq: Once | INTRAMUSCULAR | Status: DC

## 2020-11-06 MED ORDER — PROMETHAZINE HCL 25 MG/ML IJ SOLN
12.5000 mg | Freq: Once | INTRAMUSCULAR | Status: AC
  Administered 2020-11-06: 05:00:00 12.5 mg via INTRAVENOUS
  Filled 2020-11-06: qty 1

## 2020-11-06 MED ORDER — LIDOCAINE HCL (PF) 1 % IJ SOLN
INTRAMUSCULAR | Status: AC
  Filled 2020-11-06: qty 30

## 2020-11-06 MED ORDER — LACTATED RINGERS IV SOLN: INTRAVENOUS | Status: DC

## 2020-11-06 NOTE — MAU Provider Note (Addendum)
History     CSN: [id number]  Arrival date and time: 11/05/20 2347   Event Date/Time   First Provider Initiated Contact with Patient 11/06/20 0011      Chief Complaint  Patient presents with  . Vaginal Bleeding   Andrea Vaughn is a 19 y.o. G1P0 who presents to MAU following miscarriage. Patient was diagnosed with a miscarriage on 11/30 and given cytotec on that day. Patient reports that she has been bleeding since then but tonight vaginal bleeding got heavier. Patient reports that she started passing large fruit sized clots today and vaginal bleeding is dark red, soaking through multiple pads today. Denies abdominal pain or cramping.     OB History    Gravida  1   Para      Term      Preterm      AB      Living        SAB      IAB      Ectopic      Multiple      Live Births              Past Medical History:  Diagnosis Date  . ADHD (attention deficit hyperactivity disorder)   . Seasonal allergies     Past Surgical History:  Procedure Laterality Date  . NO PAST SURGERIES      Family History  Problem Relation Age of Onset  . Anemia Mother   . Healthy Father     Social History   Tobacco Use  . Smoking status: Never Smoker  . Smokeless tobacco: Never Used  Vaping Use  . Vaping Use: Former  Substance Use Topics  . Alcohol use: Not Currently  . Drug use: Yes    Comment: October 2021    Allergies:  Allergies  Allergen Reactions  . Penicillins Rash    " antibiotic" mom unsure of med.  Fine rash with Amoxicillin     Medications Prior to Admission  Medication Sig Dispense Refill Last Dose  . ibuprofen (ADVIL) 800 MG tablet Take 1 tablet (800 mg total) by mouth every 8 (eight) hours as needed. 60 tablet 1   . Prenatal Vit-Fe Fumarate-FA (PRENATAL VITAMINS) 28-0.8 MG TABS Take 1 tablet by mouth daily. 90 tablet 0   . promethazine (PHENERGAN) 12.5 MG tablet Take 1 tablet (12.5 mg total) by mouth every 6 (six) hours as needed for nausea  or vomiting. 30 tablet 0     Review of Systems  Constitutional: Negative.   Respiratory: Negative.   Cardiovascular: Negative.   Gastrointestinal: Negative.   Genitourinary: Positive for vaginal bleeding. Negative for difficulty urinating, dysuria, frequency, pelvic pain and urgency.  Musculoskeletal: Negative.   Neurological: Negative.   Psychiatric/Behavioral: Negative.    Physical Exam   Blood pressure (!) 112/56, pulse 72, temperature 98.5 F (36.9 C), resp. rate 16, height 5\' 2"  (1.575 m), weight 53.5 kg.  Physical Exam Vitals and nursing note reviewed. Exam conducted with a chaperone present.  Cardiovascular:     Rate and Rhythm: Normal rate and regular rhythm.  Pulmonary:     Effort: Pulmonary effort is normal. No respiratory distress.     Breath sounds: Normal breath sounds. No wheezing.  Abdominal:     General: There is no distension.     Palpations: Abdomen is soft. There is no mass.     Tenderness: There is no abdominal tenderness. There is no guarding.  Genitourinary:    Exam position: Lithotomy  position.     Vagina: Bleeding present.     Cervix: Dilated.     Comments: Pelvic exam: Cervix pink, visually open with products at os, unable to remove with sponge stick or kelly, large amount of dark red vaginal bleeding with multiple large clots passed upon insertion of speculum, without lesion, vaginal walls and external genitalia normal Musculoskeletal:     Right lower leg: No edema.     Left lower leg: No edema.  Skin:    General: Skin is warm and dry.  Neurological:     Mental Status: She is alert and oriented to person, place, and time.  Psychiatric:        Mood and Affect: Mood normal.        Behavior: Behavior normal.        Thought Content: Thought content normal.    MAU Course  Procedures  MDM Orders Placed This Encounter  Procedures  . US OB Transvaginal  . CBC  . hCG, quantitative, pregnancy  . Type and screen [facility]  .  Insert peripheral IV   Labs and [place] report reviewed:  Results for orders placed or performed during the hospital encounter of 11/05/20 (from the past 24 hour(s))  Type and screen [facility]     Status: None   Collection Time: 11/06/20 12:09 AM  Result Value Ref Range   ABO/RH(D) AB POS    Antibody Screen NEG    Sample Expiration      11/09/2020,2359 Performed at [facility], [address], [place], [place] [address]   CBC     Status: Abnormal   Collection Time: 11/06/20 12:09 AM  Result Value Ref Range   WBC 5.1 4.0 - 10.5 K/uL   RBC 3.85 (L) 3.87 - 5.11 MIL/uL   Hemoglobin 11.3 (L) 12.0 - 15.0 g/dL   HCT 27.7 (L) 41.2 - 87.8 %   MCV 86.8 80.0 - 100.0 fL   MCH 29.4 26.0 - 34.0 pg   MCHC 33.8 30.0 - 36.0 g/dL   RDW 67.6 72.0 - 94.7 %   Platelets 280 150 - 400 K/uL   nRBC 0.0 0.0 - 0.2 %  hCG, quantitative, pregnancy     Status: Abnormal   Collection Time: 11/06/20 12:09 AM  Result Value Ref Range   hCG, Beta Chain, Quant, S 58 (H) <5 mIU/mL   US OB Transvaginal  Result Date: 11/06/2020 CLINICAL DATA:  Spontaneous abortion with medication administration 10/13/2020 now with increasing vaginal bleeding EXAM: TRANSVAGINAL OB ULTRASOUND TECHNIQUE: Transvaginal ultrasound was performed for complete evaluation of the gestation as well as the maternal uterus, adnexal regions, and pelvic cul-de-sac. COMPARISON:  Ultrasound 10/13/2020 FINDINGS: Intrauterine gestational sac: None Yolk sac:  Not Visualized. Embryo:  Not Visualized. Cardiac Activity: Not Visualized. Maternal uterus/adnexae: Anteverted maternal uterus. Heterogeneous thickening of the endometrium with more focal thickening and debris in the lower uterine segment as well as color Doppler vascularity in the thickened uterine fundus. IMPRESSION: Heterogeneous endometrium with marked color vascularity towards the fundus and a larger more heterogeneous focus of material in the endometrial canal towards the lower  uterine segment. Findings could reflect retained products of conception in the appropriate clinical setting. Electronically Signed   By: Kreg Shropshire M.D.   On: 11/06/2020 01:21   Discussed results of [place] and lab work with Dr Mayford Knife. Recommends patient having D&E. Discussed having procedure in the OR vs in MAU.  Discussed plan of care with patient  and gave both options of OR vs MAU- patient prefers to have MAB completed in MAU.   Antibiotic order placed for completion prior to MAB and after. Dr Mayford Knife recommended Doxy 200mg  prior to Doxy 100mg  after.   Dr to come perform procedure once antibiotics completed.  Antibiotics completed @ 0430, patient given dilaudid and phenergan for pain.   Meds ordered this encounter  Medications  . lactated ringers infusion  . doxycycline (VIBRAMYCIN) 200 mg in dextrose 5 % 250 mL IVPB    Order Specific Question:   Antibiotic Indication:    Answer:   Other Indication (list below)    Order Specific Question:   Other Indication:    Answer:   before MAB procedure  . doxycycline (VIBRAMYCIN) 100 mg in sodium chloride 0.9 % 250 mL IVPB    Order Specific Question:   Antibiotic Indication:    Answer:   Other Indication (list below)    Order Specific Question:   Other Indication:    Answer:   after MAB procedure  . HYDROmorphone (DILAUDID) injection 1 mg  . promethazine (PHENERGAN) injection 12.5 mg   Dr to bedside to perform MAB. Patient to receive second course of Doxy after procedure. Methergine IM x1. Discussed reasons to return to MAU. Follow up in the office in 1-2 weeks. Return to MAU as needed. Pt stable at time of discharge.    Assessment and Plan   1. Retained products of conception after miscarriage    Discharge home Follow up in the office in 1-2 weeks - message sent to office for scheduling  Return to MAU as needed for reasons discussed and/or emergencies    Follow-up Information    [facility].  Schedule an appointment as soon as possible for a visit.   Specialty: Obstetrics and Gynecology Why: Make appointment to be seen in 1-2 weeks for follow up from miscarriage. It is important that you go to this appointment.  Contact information: [address] [place] [address] [place] [phone]              [id number] CNM 11/06/2020, 5:32 AM

## 2020-11-06 NOTE — MAU Note (Signed)
Dr Mayford Knife in to do manual uterine aspiration for retained POC. U/S in during procedure for u/s guidance. Pt with minimal bleeding afterward and moderate cramping which soon decreased to minimal cramping.

## 2020-11-06 NOTE — MAU Note (Signed)
Checked on pt and she states she threw up earlier in trash can. ASked if needed nausea med and just wants ice water.

## 2020-11-06 NOTE — Discharge Instructions (Signed)
Miscarriage A miscarriage is the loss of an unborn baby (fetus) before the 20th week of pregnancy. Follow these instructions at home: Medicines   Take over-the-counter and prescription medicines only as told by your doctor.  If you were prescribed antibiotic medicine, take it as told by your doctor. Do not stop taking the antibiotic even if you start to feel better.  Do not take NSAIDs unless your doctor says that this is safe for you. NSAIDs include aspirin and ibuprofen. These medicines can cause bleeding. Activity  Rest as directed. Ask your doctor what activities are safe for you.  Have someone help you at home during this time. General instructions  Write down how many pads you use each day and how soaked they are.  Watch the amount of tissue or clumps of blood (blood clots) that you pass from your vagina. Save any large amounts of tissue for your doctor.  Do not use tampons, douche, or have sex until your doctor approves.  To help you and your partner with the process of grieving, talk with your doctor or seek counseling.  When you are ready, meet with your doctor to talk about steps you should take for your health. Also, talk with your doctor about steps to take to have a healthy pregnancy in the future.  Keep all follow-up visits as told by your doctor. This is important. Contact a doctor if:  You have a fever or chills.  You have vaginal discharge that smells bad.  You have more bleeding. Get help right away if:  You have very bad cramps or pain in your back or belly.  You pass clumps of blood that are walnut-sized or larger from your vagina.  You pass tissue that is walnut-sized or larger from your vagina.  You soak more than 1 regular pad in an hour.  You get light-headed or weak.  You faint (pass out).  You have feelings of sadness that do not go away, or you have thoughts of hurting yourself. Summary  A miscarriage is the loss of an unborn baby before  the 20th week of pregnancy.  Follow your doctor's instructions for home care. Keep all follow-up appointments.  To help you and your partner with the process of grieving, talk with your doctor or seek counseling. This information is not intended to replace advice given to you by your health care provider. Make sure you discuss any questions you have with your health care provider. Document Revised: 02/22/2019 Document Reviewed: 12/06/2016 Elsevier Patient Education  2020 Elsevier Inc.  

## 2020-11-09 LAB — SURGICAL PATHOLOGY

## 2020-11-11 ENCOUNTER — Other Ambulatory Visit: Payer: Self-pay

## 2020-11-11 ENCOUNTER — Emergency Department (HOSPITAL_COMMUNITY)
Admission: EM | Admit: 2020-11-11 | Discharge: 2020-11-11 | Disposition: A | Discharge status: Home / Self Care | Payer: Medicaid Other | Attending: Emergency Medicine | Admitting: Emergency Medicine

## 2020-11-11 DIAGNOSIS — U071 COVID-19: Secondary | ICD-10-CM | POA: Insufficient documentation

## 2020-11-11 DIAGNOSIS — B349 Viral infection, unspecified: Secondary | ICD-10-CM | POA: Diagnosis not present

## 2020-11-11 DIAGNOSIS — R519 Headache, unspecified: Secondary | ICD-10-CM | POA: Diagnosis present

## 2020-11-11 LAB — RESP PANEL BY RT-PCR (FLU A&B, COVID) ARPGX2
Influenza A by PCR: NEGATIVE
Influenza B by PCR: NEGATIVE
SARS Coronavirus 2 by RT PCR: POSITIVE — AB

## 2020-11-11 NOTE — Discharge Instructions (Signed)
Your history, physical exam, and vital signs here in the ED are entirely reassuring.  Do not feel as though chest x-ray or laboratory work-up is warranted at this time.  Instead, we will simply obtain a respiratory panel by PCR to evaluate for COVID-19 or influenza.  We will notify you if you are positive.  You may also check your MyChart app.  Maintain isolation precautions pending results of your testing.  Even though your mom is vaccinated, I recommend keeping a distance and isolating in her room.  While you may return to your employment at [employer] in 2 days if negative, if positive for COVID-19 you will need to hold off on returning until 11/17/2019.

## 2020-11-11 NOTE — ED Triage Notes (Signed)
Pt reports onset of bodyaches, headache, sore throat since yesterday. Denies fever. VSS.

## 2020-11-11 NOTE — ED Notes (Signed)
Patient verbalizes understanding of discharge instructions. Opportunity for questioning and answers were provided. Armband removed by staff, pt discharged from ED ambulatory.   

## 2020-11-11 NOTE — ED Provider Notes (Signed)
[facility] EMERGENCY DEPARTMENT Provider Note   CSN: [id number] Arrival date & time: 11/11/20  1031     History Chief Complaint  Patient presents with  . Sore Throat  . bodyaches    Andrea Vaughn is a 19 y.o. female with no significant past medical history presents the ED with a 1 day history of headache generalized body aches, and sore throat symptoms.  On my examination, patient reports that she went with her family for Christmas. She lives at home with her mother who is fully immunized. She works at [employer] and has not received the COVID-19 vaccination. She states that yesterday while at work she started to feel poorly developing generalized body aches, headache, congestion, mild sore throat symptoms, and mild nonproductive cough. She also felt mildly nauseated, but no emesis. She is eating and drinking well. Mildly diminished appetite. She took TheraFlu last night with some improvement. She denies any chest pain, difficulty breathing, abdominal pain, urinary symptoms, or changes in bowel habits.  HPI     Past Medical History:  Diagnosis Date  . ADHD (attention deficit hyperactivity disorder)   . Seasonal allergies     There are no problems to display for this patient.   Past Surgical History:  Procedure Laterality Date  . NO PAST SURGERIES       OB History    Gravida  1   Para      Term      Preterm      AB      Living        SAB      IAB      Ectopic      Multiple      Live Births              Family History  Problem Relation Age of Onset  . Anemia Mother   . Healthy Father     Social History   Tobacco Use  . Smoking status: Never Smoker  . Smokeless tobacco: Never Used  Vaping Use  . Vaping Use: Former  Substance Use Topics  . Alcohol use: Not Currently  . Drug use: Yes    Comment: October 2021    Home Medications Prior to Admission medications   Medication Sig Start Date End Date Taking? Authorizing  Provider  ibuprofen (ADVIL) 800 MG tablet Take 1 tablet (800 mg total) by mouth every 8 (eight) hours as needed. 10/20/20   Bernerd Limbo, CNM  Prenatal Vit-Fe Fumarate-FA (PRENATAL VITAMINS) 28-0.8 MG TABS Take 1 tablet by mouth daily. 10/03/20   Molpus, John, MD  promethazine (PHENERGAN) 12.5 MG tablet Take 1 tablet (12.5 mg total) by mouth every 6 (six) hours as needed for nausea or vomiting. 10/13/20   Bernerd Limbo, CNM  cetirizine (ZYRTEC) 10 MG tablet Take 10 mg by mouth daily as needed for allergies.  10/03/20  [provider]  methylphenidate (METADATE CD) 10 MG CR capsule Take 10 mg by mouth every morning.  10/03/20  [provider]    Allergies    Penicillins  Review of Systems   Review of Systems  All other systems reviewed and are negative.   Physical Exam Updated Vital Signs BP 112/64 (BP Location: Right Arm)   Pulse 93   Temp 98.8 F (37.1 C) (Oral)   Resp 16   SpO2 100%   Physical Exam Vitals and nursing note reviewed. Exam conducted with a chaperone present.  Constitutional:  Appearance: Normal appearance.  HENT:     Head: Normocephalic and atraumatic.     Mouth/Throat:     Pharynx: Oropharynx is clear. No oropharyngeal exudate.     Comments: No trismus.  Tolerating secretions.  No uvular deviation.  No asymmetries. Eyes:     General: No scleral icterus.    Conjunctiva/sclera: Conjunctivae normal.  Cardiovascular:     Rate and Rhythm: Normal rate and regular rhythm.     Pulses: Normal pulses.     Heart sounds: Normal heart sounds.  Pulmonary:     Effort: Pulmonary effort is normal. No respiratory distress.     Breath sounds: Normal breath sounds. No stridor. No wheezing or rales.     Comments: Speaks in full sentences.  Clear to auscultation bilaterally.  No increased work of breathing.  No tachypnea.  100% on RA. Abdominal:     General: Abdomen is flat. There is no distension.     Palpations: Abdomen is soft.      Tenderness: There is no abdominal tenderness. There is no guarding.  Musculoskeletal:     Right lower leg: No edema.     Left lower leg: No edema.  Skin:    General: Skin is dry.     Capillary Refill: Capillary refill takes less than 2 seconds.  Neurological:     Mental Status: She is alert.     GCS: GCS eye subscore is 4. GCS verbal subscore is 5. GCS motor subscore is 6.  Psychiatric:        Mood and Affect: Mood normal.        Behavior: Behavior normal.        Thought Content: Thought content normal.     ED Results / Procedures / Treatments   Labs (all labs ordered are listed, but only abnormal results are displayed) Labs Reviewed  RESP PANEL BY RT-PCR (FLU A&B, COVID) [id number]    EKG None  Radiology No results found.  Procedures Procedures (including critical care time)  Medications Ordered in ED Medications - No data to display  ED Course  I have reviewed the triage vital signs and the nursing notes.  Pertinent labs & imaging results that were available during my care of the patient were reviewed by me and considered in my medical decision making (see chart for details).    MDM Rules/Calculators/A&P                          Patient with symptoms consistent with viral illness x 1 days.  Given brevity of illness and normal breath sounds, do not feel as though chest x-ray is warranted at this time.  Symptoms are likely of viral etiology.  Discussed that antibiotics are not indicated for viral infections.  Patient will be discharged with symptomatic treatment.  Patient is tolerating food and liquid without difficulty and I do not believe that laboratory work-up would yield any significant findings.  I emphasized the importance of rest, continued oral hydration, and antipyretics as needed for fever control.    They were provided opportunity to ask any additional questions and have none at this time.  Prior to discharge patient is feeling well, agreeable with plan for  discharge home.  They have expressed understanding of verbal discharge instructions as well as return precautions and are agreeable to the plan.   I emphasized the importance of checking her temperature regularly and taking Tylenol or ibuprofen as needed for fever control.  She lives at home with her mother who is vaccinated.  I encouraged isolation precautions, at least pending results of her testing.  ED return precautions discussed.  Patient voices understanding and is agreeable to the plan.  Andrea Vaughn was evaluated in Emergency Department on 11/11/2020 for the symptoms described in the history of present illness. She was evaluated in the context of the global COVID-19 pandemic, which necessitated consideration that the patient might be at risk for infection with the SARS-CoV-2 virus that causes COVID-19. Institutional protocols and algorithms that pertain to the evaluation of patients at risk for COVID-19 are in a state of rapid change based on information released by regulatory bodies including the CDC and federal and state organizations. These policies and algorithms were followed during the patient's care in the ED.  Final Clinical Impression(s) / ED Diagnoses Final diagnoses:  Viral illness    Rx / DC Orders ED Discharge Orders    None       Lorelee New, PA-C 11/11/20 1301    Pricilla Loveless, MD 11/12/20 1415

## 2020-11-23 ENCOUNTER — Ambulatory Visit: Payer: Medicaid Other | Admitting: Obstetrics and Gynecology

## 2021-07-08 ENCOUNTER — Encounter (HOSPITAL_COMMUNITY): Payer: Self-pay

## 2021-07-08 ENCOUNTER — Other Ambulatory Visit: Payer: Self-pay

## 2021-07-08 ENCOUNTER — Emergency Department (HOSPITAL_COMMUNITY)
Admission: EM | Admit: 2021-07-08 | Discharge: 2021-07-08 | Disposition: A | Discharge status: Home / Self Care | Payer: Medicaid Other | Attending: Emergency Medicine | Admitting: Emergency Medicine

## 2021-07-08 DIAGNOSIS — Z711 Person with feared health complaint in whom no diagnosis is made: Secondary | ICD-10-CM

## 2021-07-08 DIAGNOSIS — D72829 Elevated white blood cell count, unspecified: Secondary | ICD-10-CM | POA: Diagnosis not present

## 2021-07-08 DIAGNOSIS — N7689 Other specified inflammation of vagina and vulva: Secondary | ICD-10-CM | POA: Insufficient documentation

## 2021-07-08 DIAGNOSIS — Z202 Contact with and (suspected) exposure to infections with a predominantly sexual mode of transmission: Secondary | ICD-10-CM | POA: Insufficient documentation

## 2021-07-08 DIAGNOSIS — B9689 Other specified bacterial agents as the cause of diseases classified elsewhere: Secondary | ICD-10-CM | POA: Diagnosis not present

## 2021-07-08 LAB — URINALYSIS, ROUTINE W REFLEX MICROSCOPIC
Bacteria, UA: NONE SEEN
Bilirubin Urine: NEGATIVE
Glucose, UA: NEGATIVE mg/dL
Hgb urine dipstick: NEGATIVE
Ketones, ur: 5 mg/dL — AB
Nitrite: NEGATIVE
Protein, ur: NEGATIVE mg/dL
Specific Gravity, Urine: 1.026 (ref 1.005–1.030)
pH: 5 (ref 5.0–8.0)

## 2021-07-08 LAB — WET PREP, GENITAL
Sperm: NONE SEEN
Trich, Wet Prep: NONE SEEN
WBC, Wet Prep HPF POC: NONE SEEN
Yeast Wet Prep HPF POC: NONE SEEN

## 2021-07-08 LAB — PREGNANCY, URINE: Preg Test, Ur: NEGATIVE

## 2021-07-08 MED ORDER — CEFTRIAXONE SODIUM 1 G IJ SOLR
500.0000 mg | Freq: Once | INTRAMUSCULAR | Status: AC
  Administered 2021-07-08: 500 mg via INTRAMUSCULAR
  Filled 2021-07-08: qty 10

## 2021-07-08 MED ORDER — METRONIDAZOLE 500 MG PO TABS: 500.0000 mg | ORAL_TABLET | Freq: Two times a day (BID) | ORAL | 0 refills | Status: AC

## 2021-07-08 MED ORDER — LIDOCAINE HCL 1 % IJ SOLN
INTRAMUSCULAR | Status: AC
  Administered 2021-07-08: 1.2 mL
  Filled 2021-07-08: qty 20

## 2021-07-08 MED ORDER — DOXYCYCLINE HYCLATE 100 MG PO CAPS: 100.0000 mg | ORAL_CAPSULE | Freq: Two times a day (BID) | ORAL | 0 refills | Status: AC

## 2021-07-08 NOTE — ED Provider Notes (Signed)
[facility]-EMERGENCY DEPT Provider Note   CSN: [id number] Arrival date & time: 07/08/21  2145     History Chief Complaint  Patient presents with   Vaginal Discharge    Andrea Vaughn is a 20 y.o. female with no prior past medical history the presents the emerge department today for vaginal discharge.  Patient states that she started having vaginal discharge 2 days ago, is concerned about STDs.  States that she has had intercourse with the same partner over the past couple of months, however she suspects that he has an STD.  Partner did not tell patient that he has an STD, partner has no symptoms.  Patient denies intercourse with anyone else.  Denies any vaginal or pelvic pain.  Denies any vaginal bleeding.  Denies any itching.  Patient denies any rectal pain, change in bowel movements.  Denies any fevers, vomiting or any new nausea.  Denies any vaginal lesions or rashes.  States that vaginal discharge does have an odor.  Denies placing any foreign objects into her vagina, no new soaps or detergents.  Denies any dysuria or hematuria.  She states that she is sure that her partner has an STD because he has been " secretive after coming back from the doctor's office for an STD check."  HPI     Past Medical History:  Diagnosis Date   ADHD (attention deficit hyperactivity disorder)    Seasonal allergies     There are no problems to display for this patient.   Past Surgical History:  Procedure Laterality Date   NO PAST SURGERIES       OB History     Gravida  1   Para      Term      Preterm      AB      Living         SAB      IAB      Ectopic      Multiple      Live Births              Family History  Problem Relation Age of Onset   Anemia Mother    Healthy Father     Social History   Tobacco Use   Smoking status: Never   Smokeless tobacco: Never  Vaping Use   Vaping Use: Former  Substance Use Topics   Alcohol use: Not  Currently   Drug use: Yes    Comment: October 2021    Home Medications Prior to Admission medications   Medication Sig Start Date End Date Taking? Authorizing Provider  doxycycline (VIBRAMYCIN) 100 MG capsule Take 1 capsule (100 mg total) by mouth 2 (two) times daily for 7 days. 07/08/21 07/15/21 Yes Kalla Watson, PA-C  metroNIDAZOLE (FLAGYL) 500 MG tablet Take 1 tablet (500 mg total) by mouth 2 (two) times daily for 7 days. 07/08/21 07/15/21 Yes Maddeline Roorda, PA-C  ibuprofen (ADVIL) 800 MG tablet Take 1 tablet (800 mg total) by mouth every 8 (eight) hours as needed. Patient not taking: Reported on 07/08/2021 10/20/20   Bernerd Limbo, CNM  Prenatal Vit-Fe Fumarate-FA (PRENATAL VITAMINS) 28-0.8 MG TABS Take 1 tablet by mouth daily. Patient not taking: Reported on 07/08/2021 10/03/20   Molpus, Jonny Ruiz, MD  promethazine (PHENERGAN) 12.5 MG tablet Take 1 tablet (12.5 mg total) by mouth every 6 (six) hours as needed for nausea or vomiting. Patient not taking: Reported on 07/08/2021 10/13/20   Bernerd Limbo,  CNM  cetirizine (ZYRTEC) 10 MG tablet Take 10 mg by mouth daily as needed for allergies.  10/03/20  [provider]  methylphenidate (METADATE CD) 10 MG CR capsule Take 10 mg by mouth every morning.  10/03/20  [provider]    Allergies    Penicillins  Review of Systems   Review of Systems  Constitutional:  Negative for diaphoresis, fatigue and fever.  Eyes:  Negative for visual disturbance.  Respiratory:  Negative for shortness of breath.   Cardiovascular:  Negative for chest pain.  Gastrointestinal:  Negative for nausea and vomiting.  Endocrine: Negative for polyphagia.  Genitourinary:  Positive for vaginal discharge. Negative for decreased urine volume, difficulty urinating, dyspareunia, dysuria, enuresis, flank pain, frequency, genital sores, hematuria, menstrual problem, pelvic pain, urgency, vaginal bleeding and vaginal pain.  Musculoskeletal:  Negative for back  pain and myalgias.  Skin:  Negative for color change, pallor, rash and wound.  Neurological:  Negative for syncope, weakness, light-headedness, numbness and headaches.  Psychiatric/Behavioral:  Negative for behavioral problems and confusion.    Physical Exam Updated Vital Signs BP 91/80   Pulse 67   Temp 98.3 F (36.8 C) (Oral)   Resp 19   Ht 5\' 4"  (1.626 m)   Wt 52.6 kg   SpO2 99%   BMI 19.91 kg/m   Physical Exam Constitutional:      General: She is not in acute distress.    Appearance: Normal appearance. She is not ill-appearing, toxic-appearing or diaphoretic.  HENT:     Head: Normocephalic and atraumatic.  Eyes:     Extraocular Movements: Extraocular movements intact.     Pupils: Pupils are equal, round, and reactive to light.  Cardiovascular:     Rate and Rhythm: Normal rate and regular rhythm.     Pulses: Normal pulses.  Pulmonary:     Effort: Pulmonary effort is normal.     Breath sounds: Normal breath sounds.  Abdominal:     General: Abdomen is flat. There is no distension.     Tenderness: There is no abdominal tenderness. There is no guarding.  Genitourinary:    Comments: Chaperone present.  External vaginal exam without any abnormalities.  Internal vaginal exam with mild white discharge in vaginal vault.  Cervix without any discharge, nonfriable, os closed.  Bimanual exam without any adnexal or cervical motion tenderness.  Nonpainful. Musculoskeletal:        General: Normal range of motion.  Skin:    General: Skin is warm and dry.     Capillary Refill: Capillary refill takes less than 2 seconds.  Neurological:     General: No focal deficit present.     Mental Status: She is alert and oriented to person, place, and time.  Psychiatric:        Mood and Affect: Mood normal.        Behavior: Behavior normal.        Thought Content: Thought content normal.    ED Results / Procedures / Treatments   Labs (all labs ordered are listed, but only abnormal results  are displayed) Labs Reviewed  WET PREP, GENITAL - Abnormal; Notable for the following components:      Result Value   Clue Cells Wet Prep HPF POC PRESENT (*)    All other components within normal limits  URINALYSIS, ROUTINE W REFLEX MICROSCOPIC - Abnormal; Notable for the following components:   APPearance CLOUDY (*)    Ketones, ur 5 (*)    Leukocytes,Ua LARGE (*)  All other components within normal limits  PREGNANCY, URINE  GC/CHLAMYDIA PROBE AMP ([place]) NOT AT [facility]    EKG None  Radiology No results found.  Procedures Procedures   Medications Ordered in ED Medications  cefTRIAXone (ROCEPHIN) injection 500 mg (500 mg Intramuscular Given 07/08/21 2334)  lidocaine (XYLOCAINE) 1 % (with pres) injection (1.2 mLs  Given 07/08/21 2334)    ED Course  I have reviewed the triage vital signs and the nursing notes.  Pertinent labs & imaging results that were available during my care of the patient were reviewed by me and considered in my medical decision making (see chart for details).    MDM Rules/Calculators/A&P                           NINETTE COTTA is a 20 y.o. female with no prior past medical history the presents the emerge department today for vaginal discharge.  Patient appears very well, nonseptic appearing.  Patient with normal vaginal exam, mild white discharge in vaginal canal.  Wet prep shows clue cells, I do think patient most likely has BV however patient is extremely concerned about STD, is on the phone with her partner.  UA shows large leukocytes, white blood cells without any bacteria.  I am comfortable with treating patient with STD at this time, will also treat for BV.  Did discuss side effects of medications.  Patient is allergic to penicillins, has not rash, however willing to try Rocephin at this time, states that she does not actually think she is allergic to penicillin.  Patient will receive STD testing in 48 hours on MyChart, patient aware.  No  concerns for PID.  Patient be discharged.  Doubt need for further emergent work up at this time. I explained the diagnosis and have given explicit precautions to return to the ER including for any other new or worsening symptoms. The patient understands and accepts the medical plan as it's been dictated and I have answered their questions. Discharge instructions concerning home care and prescriptions have been given. The patient is STABLE and is discharged to home in good condition.  Final Clinical Impression(s) / ED Diagnoses Final diagnoses:  Concern about STD in female without diagnosis  Bacterial vaginosis    Rx / DC Orders ED Discharge Orders          Ordered    metroNIDAZOLE (FLAGYL) 500 MG tablet  2 times daily        07/08/21 2314    doxycycline (VIBRAMYCIN) 100 MG capsule  2 times daily        07/08/21 2314             Farrel Gordon, PA-C 07/08/21 2337    Wynetta Fines, MD 07/14/21 2238

## 2021-07-08 NOTE — ED Triage Notes (Signed)
Pt reports a cream colored vaginal discharge and foul odor x 2 days. Requesting STD evaluation.

## 2021-07-08 NOTE — Discharge Instructions (Addendum)
  You were evaluated in the Emergency Department and after careful evaluation, we did not find any emergent condition requiring admission or further testing in the hospital.   Your exam/testing today was overall reassuring.  Symptoms seem to be due to bacterial vaginosis, however I did treat you for gonorrhea chlamydia as you wanted.  Also did give you antibiotics for your BV.  Take sunscreen when taking the doxycycline and do not drink alcohol when taking the Flagyl. Check mychart in 48 hours for std testing.  Please return to the Emergency Department if you experience any worsening of your condition.  Thank you for allowing [place] to be a part of your care. Please speak to your pharmacist about any new medications prescribed today in regards to side effects or interactions with other medications.

## 2021-07-09 LAB — GC/CHLAMYDIA PROBE AMP (~~LOC~~) NOT AT ARMC
Chlamydia: NEGATIVE
Comment: NEGATIVE
Comment: NORMAL
Neisseria Gonorrhea: NEGATIVE

## 2021-08-16 ENCOUNTER — Emergency Department (HOSPITAL_COMMUNITY)
Admission: EM | Admit: 2021-08-16 | Discharge: 2021-08-16 | Disposition: A | Discharge status: Home / Self Care | Payer: Medicaid Other | Attending: Emergency Medicine | Admitting: Emergency Medicine

## 2021-08-16 ENCOUNTER — Other Ambulatory Visit: Payer: Self-pay

## 2021-08-16 ENCOUNTER — Encounter (HOSPITAL_COMMUNITY): Payer: Self-pay | Admitting: Emergency Medicine

## 2021-08-16 DIAGNOSIS — J069 Acute upper respiratory infection, unspecified: Secondary | ICD-10-CM | POA: Diagnosis not present

## 2021-08-16 DIAGNOSIS — J029 Acute pharyngitis, unspecified: Secondary | ICD-10-CM | POA: Insufficient documentation

## 2021-08-16 DIAGNOSIS — Z8616 Personal history of COVID-19: Secondary | ICD-10-CM | POA: Insufficient documentation

## 2021-08-16 DIAGNOSIS — Z20822 Contact with and (suspected) exposure to covid-19: Secondary | ICD-10-CM | POA: Insufficient documentation

## 2021-08-16 LAB — RESP PANEL BY RT-PCR (FLU A&B, COVID) ARPGX2
Influenza A by PCR: NEGATIVE
Influenza B by PCR: NEGATIVE
SARS Coronavirus 2 by RT PCR: NEGATIVE

## 2021-08-16 LAB — GROUP A STREP BY PCR: Group A Strep by PCR: NOT DETECTED

## 2021-08-16 MED ORDER — IBUPROFEN 400 MG PO TABS
600.0000 mg | ORAL_TABLET | Freq: Once | ORAL | Status: AC
  Administered 2021-08-16: 600 mg via ORAL
  Filled 2021-08-16: qty 1

## 2021-08-16 NOTE — Discharge Instructions (Signed)
Take Tylenol or Motrin as needed for fevers and pain.  If you develop swelling, difficulty swallowing, vomiting, difficulty breathing or other new concerning symptom, come back to ER for reassessment.

## 2021-08-16 NOTE — ED Notes (Signed)
Patient verbalizes understanding of discharge instructions. Opportunity for questioning and answers were provided. Armband removed by staff, pt discharged from ED ambulatory.   

## 2021-08-16 NOTE — ED Provider Notes (Signed)
[facility] EMERGENCY DEPARTMENT Provider Note   CSN: [id number] Arrival date & time: 08/16/21  1940     History Chief Complaint  Patient presents with   Sore Throat    Andrea Vaughn is a 20 y.o. female.  Presents to ER with concern for sore throat.  Today this morning she noted sore throat and later noted some body aches as well as some ear discomfort.  Had an episode of nausea but no vomiting.  No difficulty in swallowing.  Had COVID last year.  No sick contacts.  No cough or difficulty in breathing.  No known fevers at home.  HPI     Past Medical History:  Diagnosis Date   ADHD (attention deficit hyperactivity disorder)    Seasonal allergies     There are no problems to display for this patient.   Past Surgical History:  Procedure Laterality Date   NO PAST SURGERIES       OB History     Gravida  1   Para      Term      Preterm      AB      Living         SAB      IAB      Ectopic      Multiple      Live Births              Family History  Problem Relation Age of Onset   Anemia Mother    Healthy Father     Social History   Tobacco Use   Smoking status: Never   Smokeless tobacco: Never  Vaping Use   Vaping Use: Former  Substance Use Topics   Alcohol use: Not Currently   Drug use: Yes    Comment: October 2021    Home Medications Prior to Admission medications   Medication Sig Start Date End Date Taking? Authorizing Provider  ibuprofen (ADVIL) 800 MG tablet Take 1 tablet (800 mg total) by mouth every 8 (eight) hours as needed. Patient not taking: Reported on 07/08/2021 10/20/20   Bernerd Limbo, CNM  Prenatal Vit-Fe Fumarate-FA (PRENATAL VITAMINS) 28-0.8 MG TABS Take 1 tablet by mouth daily. Patient not taking: Reported on 07/08/2021 10/03/20   Molpus, Jonny Ruiz, MD  promethazine (PHENERGAN) 12.5 MG tablet Take 1 tablet (12.5 mg total) by mouth every 6 (six) hours as needed for nausea or vomiting. Patient not  taking: Reported on 07/08/2021 10/13/20   Bernerd Limbo, CNM  cetirizine (ZYRTEC) 10 MG tablet Take 10 mg by mouth daily as needed for allergies.  10/03/20  [provider]  methylphenidate (METADATE CD) 10 MG CR capsule Take 10 mg by mouth every morning.  10/03/20  [provider]    Allergies    Penicillins  Review of Systems   Review of Systems  Constitutional:  Positive for chills and fatigue. Negative for fever.  HENT:  Positive for sore throat. Negative for ear pain.   Eyes:  Negative for pain and visual disturbance.  Respiratory:  Negative for cough and shortness of breath.   Cardiovascular:  Negative for chest pain and palpitations.  Gastrointestinal:  Positive for nausea. Negative for abdominal pain and vomiting.  Genitourinary:  Negative for dysuria and hematuria.  Musculoskeletal:  Positive for myalgias. Negative for arthralgias and back pain.  Skin:  Negative for color change and rash.  Neurological:  Negative for seizures and syncope.  All other systems reviewed  and are negative.  Physical Exam Updated Vital Signs BP 110/68 (BP Location: Right Arm)   Pulse 87   Temp 98.6 F (37 C) (Oral)   Resp 17   LMP 07/15/2021 (Approximate)   SpO2 99%   Breastfeeding Unknown   Physical Exam Vitals and nursing note reviewed.  Constitutional:      General: She is not in acute distress.    Appearance: She is well-developed.  HENT:     Head: Normocephalic and atraumatic.     Mouth/Throat:     Comments: Mild erythema in posterior oropharynx, no exudates appreciated Eyes:     Conjunctiva/sclera: Conjunctivae normal.  Neck:     Comments: No swelling noted, normal ROM Cardiovascular:     Rate and Rhythm: Normal rate and regular rhythm.     Heart sounds: No murmur heard. Pulmonary:     Effort: Pulmonary effort is normal. No respiratory distress.     Breath sounds: Normal breath sounds.  Abdominal:     Palpations: Abdomen is soft.     Tenderness: There  is no abdominal tenderness.  Musculoskeletal:     Cervical back: Neck supple.  Skin:    General: Skin is warm and dry.  Neurological:     General: No focal deficit present.     Mental Status: She is alert.  Psychiatric:        Mood and Affect: Mood normal.    ED Results / Procedures / Treatments   Labs (all labs ordered are listed, but only abnormal results are displayed) Labs Reviewed  GROUP A STREP BY PCR  RESP PANEL BY RT-PCR (FLU A&B, COVID) [id number]    EKG None  Radiology No results found.  Procedures Procedures   Medications Ordered in ED Medications  ibuprofen (ADVIL) tablet 600 mg (600 mg Oral Given 08/16/21 2025)    ED Course  I have reviewed the triage vital signs and the nursing notes.  Pertinent labs & imaging results that were available during my care of the patient were reviewed by me and considered in my medical decision making (see chart for details).    MDM Rules/Calculators/A&P                           20 year old presents to ER with concern for sore throat, some nausea and body aches.  On exam she appears remarkably well-appearing in no distress.  Besides a mild sore throat she denies ongoing symptoms.  COVID-negative, strep negative.  Suspect acute viral process at present.  Recommend supportive care at home, reviewed return precautions and discharged.  After the discussed management above, the patient was determined to be safe for discharge.  The patient was in agreement with this plan and all questions regarding their care were answered.  ED return precautions were discussed and the patient will return to the ED with any significant worsening of condition.  Final Clinical Impression(s) / ED Diagnoses Final diagnoses:  Viral pharyngitis  Viral upper respiratory illness    Rx / DC Orders ED Discharge Orders     None        Milagros Loll, MD 08/17/21 1517

## 2021-08-16 NOTE — ED Triage Notes (Signed)
Patient with sore throat, feeling achy, whole body aches.  She states that she has been having fevers.  This all started today.  Patient had Covid in 12/21.

## 2021-08-16 NOTE — ED Provider Notes (Signed)
Emergency Medicine Provider Triage Evaluation Note  Andrea Vaughn , a 20 y.o. female  was evaluated in triage.  Pt complains of sore throat, body aches, right ear pain starting today.  No vomiting or diarrhea but has been nauseous.  No urinary symptoms.  Fever of 100.7 F on arrival.  No treatments prior to arrival.  No COVID vaccines.  She had COVID last year.    Review of Systems  Positive: Fever, body aches Negative: UTI symptoms  Physical Exam  BP 117/65 (BP Location: Right Arm)   Pulse 98   Temp (!) 100.7 F (38.2 C) (Oral)   Resp 16   LMP 07/15/2021 (Approximate)   SpO2 98%   Breastfeeding Unknown  Gen:   Awake, no distress   Resp:  Normal effort  MSK:   Moves extremities without difficulty  Other:  ENT exam normal  Medical Decision Making  Medically screening exam initiated at 8:23 PM.  Appropriate orders placed.  Andrea Vaughn was informed that the remainder of the evaluation will be completed by another provider, this initial triage assessment does not replace that evaluation, and the importance of remaining in the ED until their evaluation is complete.  Plan: Strep test, COVID test, ibuprofen   Renne Crigler, PA-C 08/16/21 2024    Milagros Loll, MD 08/16/21 2234

## 2021-08-16 NOTE — ED Notes (Signed)
X1 no response 

## 2021-08-17 ENCOUNTER — Emergency Department (HOSPITAL_BASED_OUTPATIENT_CLINIC_OR_DEPARTMENT_OTHER)
Admission: EM | Admit: 2021-08-17 | Discharge: 2021-08-17 | Disposition: A | Discharge status: Home / Self Care | Payer: Medicaid Other | Attending: Emergency Medicine | Admitting: Emergency Medicine

## 2021-08-17 DIAGNOSIS — R509 Fever, unspecified: Secondary | ICD-10-CM | POA: Insufficient documentation

## 2021-08-17 DIAGNOSIS — Z5321 Procedure and treatment not carried out due to patient leaving prior to being seen by health care provider: Secondary | ICD-10-CM | POA: Insufficient documentation

## 2021-08-17 DIAGNOSIS — R110 Nausea: Secondary | ICD-10-CM | POA: Insufficient documentation

## 2021-08-17 DIAGNOSIS — R109 Unspecified abdominal pain: Secondary | ICD-10-CM | POA: Insufficient documentation

## 2021-08-17 LAB — PREGNANCY, URINE: Preg Test, Ur: NEGATIVE

## 2021-08-17 LAB — URINALYSIS, ROUTINE W REFLEX MICROSCOPIC
Bilirubin Urine: NEGATIVE
Glucose, UA: NEGATIVE mg/dL
Ketones, ur: 15 mg/dL — AB
Leukocytes,Ua: NEGATIVE
Nitrite: NEGATIVE
Protein, ur: NEGATIVE mg/dL
Specific Gravity, Urine: 1.03 (ref 1.005–1.030)
pH: 6 (ref 5.0–8.0)

## 2021-08-17 LAB — URINALYSIS, MICROSCOPIC (REFLEX)

## 2021-08-17 NOTE — ED Triage Notes (Signed)
Pt c/o fever, chills, body aches, nausea, abdominal pain since yesterday. Tested negative for covid and flu yesterday at [facility].

## 2021-08-18 ENCOUNTER — Emergency Department (HOSPITAL_COMMUNITY)
Admission: EM | Admit: 2021-08-18 | Discharge: 2021-08-19 | Disposition: A | Discharge status: Home / Self Care | Payer: Medicaid Other | Attending: Emergency Medicine | Admitting: Emergency Medicine

## 2021-08-18 ENCOUNTER — Encounter (HOSPITAL_COMMUNITY): Payer: Self-pay | Admitting: Emergency Medicine

## 2021-08-18 ENCOUNTER — Other Ambulatory Visit: Payer: Self-pay

## 2021-08-18 DIAGNOSIS — J029 Acute pharyngitis, unspecified: Secondary | ICD-10-CM | POA: Diagnosis present

## 2021-08-18 DIAGNOSIS — R5381 Other malaise: Secondary | ICD-10-CM | POA: Diagnosis not present

## 2021-08-18 DIAGNOSIS — R5383 Other fatigue: Secondary | ICD-10-CM | POA: Diagnosis not present

## 2021-08-18 DIAGNOSIS — R110 Nausea: Secondary | ICD-10-CM | POA: Insufficient documentation

## 2021-08-18 MED ORDER — ACETAMINOPHEN 325 MG PO TABS
650.0000 mg | ORAL_TABLET | Freq: Once | ORAL | Status: AC | PRN
  Administered 2021-08-18: 650 mg via ORAL
  Filled 2021-08-18: qty 2

## 2021-08-18 NOTE — ED Triage Notes (Signed)
Pt c/o fever and sore throat. Seen yesterday for same, strep and covid negative yesterday.

## 2021-08-18 NOTE — ED Provider Notes (Signed)
Emergency Medicine Provider Triage Evaluation Note  Andrea Vaughn , a 20 y.o. female  was evaluated in triage.  Pt complains of present sore throat she states that she was seen for the same 2 days ago, at that point she had a negative COVID and strep test.  She states that she is no better.  Review of Systems  Positive: Sore throat, fevers Negative: Syncope  Physical Exam  BP 115/65 (BP Location: Left Arm)   Pulse (!) 102   Temp (!) 101.6 F (38.7 C) (Oral)   Resp 16   SpO2 100%  Gen:   Awake, no distress   Resp:  Normal effort  MSK:   Moves extremities without difficulty  Other:  Tonsillar exudates bilaterally.  Uvula is midline.  Normal phonation and no trismus.  Medical Decision Making  Medically screening exam initiated at 8:42 PM.  Appropriate orders placed.  Edwena Blow Blank was informed that the remainder of the evaluation will be completed by another provider, this initial triage assessment does not replace that evaluation, and the importance of remaining in the ED until their evaluation is complete.  Given her ongoing fever tachycardia, and significant tonsillar exudates bilaterally we will recheck strep.   Norman Clay 08/18/21 2043    Wynetta Fines, MD 08/18/21 [phone]

## 2021-08-19 LAB — GROUP A STREP BY PCR: Group A Strep by PCR: NOT DETECTED

## 2021-08-19 MED ORDER — CEPHALEXIN 250 MG PO CAPS
500.0000 mg | ORAL_CAPSULE | Freq: Once | ORAL | Status: AC
  Administered 2021-08-19: 500 mg via ORAL
  Filled 2021-08-19: qty 2

## 2021-08-19 MED ORDER — CEPHALEXIN 500 MG PO CAPS: 500.0000 mg | ORAL_CAPSULE | Freq: Three times a day (TID) | ORAL | 0 refills | Status: AC

## 2021-08-19 MED ORDER — DEXAMETHASONE 4 MG PO TABS
10.0000 mg | ORAL_TABLET | Freq: Once | ORAL | Status: AC
  Administered 2021-08-19: 10 mg via ORAL
  Filled 2021-08-19: qty 3

## 2021-08-19 NOTE — ED Provider Notes (Signed)
[facility]-EMERGENCY DEPT [facility] Emergency Department Provider Note MRN:  [id number]  Arrival date & time: 08/19/21     Chief Complaint   Sore Throat   History of Present Illness   Andrea Vaughn is a 20 y.o. year-old female with no pertinent past medical history presenting to the ED with chief complaint of sore throat.  Location: Back of the throat Duration: 3 to 4 days Onset: Gradual Timing: Constant pain Description: Sharp Severity: Severe Exacerbating/Alleviating Factors: Worse with swallow Associated Symptoms: Malaise, fatigue, mild nausea Pertinent Negatives: No cough, no chest pain, no shortness of breath, no abdominal pain, no vomiting or diarrhea  Additional History: Not improving with Motrin at home  Review of Systems  A complete 10 system review of systems was obtained and all systems are negative except as noted in the HPI and PMH.   Patient's Health History    Past Medical History:  Diagnosis Date   ADHD (attention deficit hyperactivity disorder)    Seasonal allergies     Past Surgical History:  Procedure Laterality Date   NO PAST SURGERIES      Family History  Problem Relation Age of Onset   Anemia Mother    Healthy Father     Social History   Socioeconomic History   Marital status: Single    Spouse name: Not on file   Number of children: Not on file   Years of education: Not on file   Highest education level: Not on file  Occupational History   Not on file  Tobacco Use   Smoking status: Never   Smokeless tobacco: Never  Vaping Use   Vaping Use: Former  Substance and Sexual Activity   Alcohol use: Not Currently   Drug use: Yes    Comment: October 2021   Sexual activity: Yes  Other Topics Concern   Not on file  Social History Narrative   Not on file   Social Determinants of Health   Financial Resource Strain: Not on file  Food Insecurity: Not on file  Transportation Needs: Not on file  Physical Activity: Not on file   Stress: Not on file  Social Connections: Not on file  Intimate Partner Violence: Not on file     Physical Exam   Vitals:   08/18/21 2027 08/19/21 0033  BP: 115/65 (!) 100/59  Pulse: (!) 102 64  Resp: 16 14  Temp: (!) 101.6 F (38.7 C) 99.1 F (37.3 C)  SpO2: 100% 100%    CONSTITUTIONAL: Well-appearing, NAD NEURO:  Alert and oriented x 3, no focal deficits EYES:  eyes equal and reactive ENT/NECK:  no LAD, no JVD; bilateral erythematous and swollen tonsils with exudate CARDIO: Regular rate, well-perfused, normal S1 and S2 PULM:  CTAB no wheezing or rhonchi GI/GU:  normal bowel sounds, non-distended, non-tender MSK/SPINE:  No gross deformities, no edema SKIN:  no rash, atraumatic PSYCH:  Appropriate speech and behavior  *Additional and/or pertinent findings included in MDM below  Diagnostic and Interventional Summary    EKG Interpretation  Date/Time:    Ventricular Rate:    PR Interval:    QRS Duration:   QT Interval:    QTC Calculation:   R Axis:     Text Interpretation:         Labs Reviewed  GROUP A STREP BY PCR    No orders to display    Medications  dexamethasone (DECADRON) tablet 10 mg (has no administration in time range)  cephALEXin (KEFLEX) capsule 500 mg (  has no administration in time range)  acetaminophen (TYLENOL) tablet 650 mg (650 mg Oral Given 08/18/21 2059)     Procedures  /  Critical Care Procedures  ED Course and Medical Decision Making  I have reviewed the triage vital signs, the nursing notes, and pertinent available records from the EMR.  Listed above are laboratory and imaging tests that I personally ordered, reviewed, and interpreted and then considered in my medical decision making (see below for details).  Exam is suspicious for strep throat.  Symptoms not going away after 3 days, fever here.  High enough index of suspicion to treat.  Fever and tachycardia resolved after Tylenol, no signs of more significant contiguous infection  or abscess, appropriate for discharge.       Elmer Sow. Pilar Plate, MD [facility] Emergency Medicine [facility] [email or web address]  Final Clinical Impressions(s) / ED Diagnoses     ICD-10-CM   1. Pharyngitis, unspecified etiology  J02.9       ED Discharge Orders          Ordered    cephALEXin (KEFLEX) 500 MG capsule  3 times daily        08/19/21 0405             Discharge Instructions Discussed with and Provided to Patient:    Discharge Instructions      You were evaluated in the Emergency Department and after careful evaluation, we did not find any emergent condition requiring admission or further testing in the hospital.  Your exam/testing today is overall reassuring.  Symptoms may be due to strep throat.  Please take the Keflex antibiotic as directed, use Tylenol or Motrin for pain, drink plenty of fluids.  Please return to the Emergency Department if you experience any worsening of your condition.   Thank you for allowing [place] to be a part of your care.       Sabas Sous, MD 08/19/21 [phone]

## 2021-08-19 NOTE — Discharge Instructions (Addendum)
You were evaluated in the Emergency Department and after careful evaluation, we did not find any emergent condition requiring admission or further testing in the hospital.  Your exam/testing today is overall reassuring.  Symptoms may be due to strep throat.  Please take the Keflex antibiotic as directed, use Tylenol or Motrin for pain, drink plenty of fluids.  Please return to the Emergency Department if you experience any worsening of your condition.   Thank you for allowing [place] to be a part of your care.

## 2021-09-08 ENCOUNTER — Ambulatory Visit: Payer: Medicaid Other

## 2021-10-18 ENCOUNTER — Encounter: Payer: Medicaid Other | Admitting: Obstetrics and Gynecology

## 2021-12-27 ENCOUNTER — Other Ambulatory Visit: Payer: Self-pay

## 2021-12-27 ENCOUNTER — Ambulatory Visit (INDEPENDENT_AMBULATORY_CARE_PROVIDER_SITE_OTHER): Payer: Medicaid Other | Admitting: *Deleted

## 2021-12-27 VITALS — BP 99/58 | HR 78 | Temp 98.3°F | Ht 63.0 in | Wt 117.6 lb

## 2021-12-27 DIAGNOSIS — R110 Nausea: Secondary | ICD-10-CM

## 2021-12-27 DIAGNOSIS — A6009 Herpesviral infection of other urogenital tract: Secondary | ICD-10-CM | POA: Insufficient documentation

## 2021-12-27 DIAGNOSIS — Z3480 Encounter for supervision of other normal pregnancy, unspecified trimester: Secondary | ICD-10-CM | POA: Insufficient documentation

## 2021-12-27 MED ORDER — DOXYLAMINE-PYRIDOXINE 10-10 MG PO TBEC: 2.0000 | DELAYED_RELEASE_TABLET | Freq: Every evening | ORAL | 2 refills | Status: DC | PRN

## 2021-12-27 MED ORDER — BLOOD PRESSURE MONITOR AUTOMAT DEVI: 1.0000 | Freq: Every day | 0 refills | Status: DC

## 2021-12-27 MED ORDER — GOJJI WEIGHT SCALE MISC
1.0000 | Freq: Every day | 0 refills | Status: DC | PRN
Start: 2021-12-27 — End: 2022-05-16

## 2021-12-27 NOTE — Patient Instructions (Signed)
AREA PEDIATRIC/FAMILY PRACTICE PHYSICIANS  [facility] [address] [place], [place] [address] Phone - [phone]   Fax - [phone]  [facility] AM[facility] [address] [place], [place]  [address] Phone - [phone]   Fax - [phone]  [facility] [address] [place], [place]  [address] Phone - [phone]   Fax - [phone]  [facility] [address] [place], [place]  [address] Phone - [phone]   Fax - [phone]  [facility] [address] [place], [place]  [address] Phone - [phone]   Fax - [phone]  [facility] [address], [address] [place], [place]  [address] Phone - [phone]   Fax - [phone]  [facility] [address] [place], [place]  [address] Phone - [phone]   Fax - [phone]  [facility] [address] [place], [place]  [address] Phone - [phone]   Fax - [phone]  [facility] [address] [place], [place]  [address] Phone - [phone]   Fax - [phone] [facility] [address] [place], [place]  [address] Phone - [phone]   Fax - [phone]  [facility] [address] [place], [place]  [address] Phone - [phone]   Fax - [phone]  [facility] [address] [place], [place]  [address] Phone - [phone]   Fax - [phone]  [facility] [address] [place], [place]  [address] Phone - [phone]   Fax - [phone]  SHILPA GOSRANI [address] [place], [place]  [address] Phone - [phone]  [facility] [address] [place], [place]  [address] Phone - [phone]   Fax - [phone]  [facility] [address] [place], [place]  [address] Phone - [phone]   Fax - [phone]  [facility] [address] [place], [place]  [address] Phone - [phone]   Fax - [phone]  [facility] [address] [place], [place]  [address] Phone - [phone]   Fax - [phone]   [facility] [address] [place], [place]  [address] Phone - [phone]   Fax - [phone]  [facility] [address] [place], [place]  [address] Phone - [phone]   Fax - [phone]  DAVID RUBIN [address] [place], [place]  [address] Phone - [phone]   Fax - [phone]  [facility] [address] [place], [place]  [address] Phone - [phone]   Fax - [phone]  [facility] [address] [place], [place]  [address] Phone - [phone]   Fax - [phone] [facility] [address] [place], [place]  [address] Phone - [phone]   Fax - [phone]   [facility] [address] [place], [place]  [address] Phone - [phone]   Fax - [phone]   [facility] [address] [place],   [address] Phone - [phone]   Fax - [phone]   

## 2021-12-27 NOTE — Progress Notes (Signed)
° ° °  I discussed the limitations, risks, security and privacy concerns of performing an evaluation and management service by telephone and the availability of in person appointments. I also discussed with the patient that there may be a patient responsible charge related to this service. The patient expressed understanding and agreed to proceed.   Location: [facility] Patient: clinic Provider: clinic Interpreter used: no professional interpreter  PRENATAL INTAKE SUMMARY  Ms. Petkus presents today New OB Nurse Interview.  OB History     Gravida  2   Para      Term      Preterm      AB  1   Living         SAB  1   IAB      Ectopic      Multiple      Live Births             I have reviewed the patient's medical, obstetrical, social, and family histories, medications, and available lab results.  SUBJECTIVE She complains of vaginal discharge and nausea without vomiting.  OBJECTIVE Initial Nurse interview for history/labs (New OB)  last menstrual period date: around 11/08/2021 EDD: 08/15/2022  GA: [redacted]w[redacted]d G2P0010 FHT: not assessed due to gestational age  GENERAL APPEARANCE: alert, well appearing, in no apparent distress, oriented to person, place and time   ASSESSMENT Normal pregnancy  PLAN Prenatal care:  [facility] Labs to be completed at next visit Rx Diclegis for nausea Ultrasound <14 weeks to confirm dating/viability   Blood Pressure Monitor/Weight Scale BP Rx sent to [facility] Weight Scale Rx [facility]  MyChart/Babyscripts MyChart access verified. I explained pt will have some visits in office and some virtually. Babyscripts instructions given and order placed.   Placed OB Box on problem list and updated   Followup with [facility] office Patient will be called with appointment  Follow Up Instructions:   I discussed the assessment and treatment plan with the patient. The patient was provided an opportunity to ask  questions and all were answered. The patient agreed with the plan and demonstrated an understanding of the instructions.   The patient was advised to call back or seek an in-person evaluation if the symptoms worsen or if the condition fails to improve as anticipated.  I provided 40 minutes of  face-to-face time during this encounter.  Clovis Pu, RN

## 2021-12-28 ENCOUNTER — Ambulatory Visit (HOSPITAL_BASED_OUTPATIENT_CLINIC_OR_DEPARTMENT_OTHER): Payer: Medicaid Other

## 2022-01-18 ENCOUNTER — Encounter (HOSPITAL_BASED_OUTPATIENT_CLINIC_OR_DEPARTMENT_OTHER): Payer: Self-pay

## 2022-01-18 ENCOUNTER — Ambulatory Visit (INDEPENDENT_AMBULATORY_CARE_PROVIDER_SITE_OTHER): Payer: Medicaid Other

## 2022-01-18 ENCOUNTER — Other Ambulatory Visit (HOSPITAL_COMMUNITY)
Admission: RE | Admit: 2022-01-18 | Discharge: 2022-01-18 | Disposition: A | Discharge status: Home / Self Care | Payer: Medicaid Other | Source: Ambulatory Visit | Attending: Obstetrics & Gynecology | Admitting: Obstetrics & Gynecology

## 2022-01-18 ENCOUNTER — Encounter (HOSPITAL_BASED_OUTPATIENT_CLINIC_OR_DEPARTMENT_OTHER): Payer: Self-pay | Admitting: Obstetrics & Gynecology

## 2022-01-18 ENCOUNTER — Ambulatory Visit (INDEPENDENT_AMBULATORY_CARE_PROVIDER_SITE_OTHER): Payer: Medicaid Other | Admitting: *Deleted

## 2022-01-18 ENCOUNTER — Ambulatory Visit (INDEPENDENT_AMBULATORY_CARE_PROVIDER_SITE_OTHER): Payer: Medicaid Other | Admitting: Obstetrics & Gynecology

## 2022-01-18 ENCOUNTER — Other Ambulatory Visit: Payer: Self-pay

## 2022-01-18 VITALS — BP 94/62 | HR 68 | Temp 98.2°F | Wt 118.0 lb

## 2022-01-18 DIAGNOSIS — Z3481 Encounter for supervision of other normal pregnancy, first trimester: Secondary | ICD-10-CM | POA: Diagnosis not present

## 2022-01-18 DIAGNOSIS — Z8619 Personal history of other infectious and parasitic diseases: Secondary | ICD-10-CM

## 2022-01-18 DIAGNOSIS — Z3480 Encounter for supervision of other normal pregnancy, unspecified trimester: Secondary | ICD-10-CM

## 2022-01-18 DIAGNOSIS — Z3A10 10 weeks gestation of pregnancy: Secondary | ICD-10-CM

## 2022-01-18 DIAGNOSIS — Z3689 Encounter for other specified antenatal screening: Secondary | ICD-10-CM

## 2022-01-18 DIAGNOSIS — O26841 Uterine size-date discrepancy, first trimester: Secondary | ICD-10-CM

## 2022-01-18 DIAGNOSIS — O3680X1 Pregnancy with inconclusive fetal viability, fetus 1: Secondary | ICD-10-CM

## 2022-01-18 LAB — HEPATITIS C ANTIBODY: HCV Ab: NEGATIVE

## 2022-01-18 MED ORDER — ASPIRIN EC 81 MG PO TBEC: 81.0000 mg | DELAYED_RELEASE_TABLET | Freq: Every day | ORAL | 11 refills | Status: DC

## 2022-01-18 NOTE — Progress Notes (Addendum)
? ?History:  ? Andrea Vaughn is a 21 y.o. G2P0010 at [redacted]w[redacted]d by LMP being seen today for her first obstetrical visit.  Her obstetrical history is significant for  miscarriage x 1 . Patient is unsure about breast feeding.  She is having some nausea but generally keeping down liquids. Pregnancy history fully reviewed. ? ?Patient reports  no other complaints . ?  ?  ?HISTORY: ?OB History  ?Gravida Para Term Preterm AB Living  ?2 0 0 0 1 0  ?SAB IAB Ectopic Multiple Live Births  ?1 0 0 0 0  ?  ?# Outcome Date GA Lbr Len/2nd Weight Sex Delivery Anes PTL Lv  ?2 Current           ?1 SAB 09/2020          ?  ?Last pap smear was done not done due to age ? ?Past Medical History:  ?Diagnosis Date  ? ADHD (attention deficit hyperactivity disorder)   ? 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 Seasonal allergies   ? ?Past Surgical History:  ?Procedure Laterality Date  ? NO PAST SURGERIES    ? ?Family History  ?Problem Relation Age of Onset  ? Anemia Mother   ? Healthy Father   ? Cancer Maternal Grandmother   ? ?Social History  ? ?Tobacco Use  ? Smoking status: Never  ?  Passive exposure: Never  ? Smokeless tobacco: Never  ?Vaping Use  ? Vaping Use: Never used  ?Substance Use Topics  ? Alcohol use: Not Currently  ?  Comment: socially  ? Drug use: Not Currently  ?  Types: Marijuana  ?  Comment: last used 1 month ago  ? ?Allergies  ?Allergen Reactions  ? Penicillins Rash  ?  " antibiotic" mom unsure of med.  ?Fine rash with Amoxicillin ?  ? ?Current Outpatient Medications on File Prior to Visit  ?Medication Sig Dispense Refill  ? Blood Pressure Monitoring (BLOOD PRESSURE MONITOR AUTOMAT) DEVI 1 Device by Does not apply route daily. Automatic blood pressure cuff regular size. To monitor blood pressure regularly at home. ICD-10 code:Z34.90 1 each 0  ? Cholecalciferol (VITAMIN D) 125 MCG (5000 UT) CAPS Take 2 tablets by mouth daily at 6 (six) AM.    ? Doxylamine-Pyridoxine (DICLEGIS) 10-10 MG TBEC Take 2 tablets by mouth at bedtime as needed. May  add 1 tablet at breakfast and 1 tablet at lunch if needed. 100 tablet 2  ? Misc. Devices (GOJJI WEIGHT SCALE) MISC 1 Device by Does not apply route daily as needed. To weight self daily as needed at home. ICD-10 code: Z34.90 1 each 0  ? Prenatal Vit-Fe Fumarate-FA (PRENATAL VITAMINS) 28-0.8 MG TABS Take 1 tablet by mouth daily. 90 tablet 0  ? promethazine (PHENERGAN) 12.5 MG tablet Take 1 tablet (12.5 mg total) by mouth every 6 (six) hours as needed for nausea or vomiting. (Patient not taking: Reported on 07/08/2021) 30 tablet 0  ? [DISCONTINUED] cetirizine (ZYRTEC) 10 MG tablet Take 10 mg by mouth daily as needed for allergies.    ? [DISCONTINUED] methylphenidate (METADATE CD) 10 MG CR capsule Take 10 mg by mouth every morning.    ? ?No current facility-administered medications on file prior to visit.  ? ? ?Review of Systems ?Pertinent items noted in HPI and remainder of comprehensive ROS otherwise negative. ? ?Physical Exam:  ? ?Vitals:  ? 01/18/22 1731  ?BP: 94/62  ?Pulse: 68  ?Temp: 98.2 ?F (36.8 ?C)  ?Weight: 118 lb (53.5 kg)  ? ?  Fetal Heart Rate (bpm): 135 ?Bedside Ultrasound for FHR check: Viable intrauterine pregnancy with positive cardiac activity noted, fetal heart rate 135bpm ?Patient informed that the ultrasound is considered a limited obstetric ultrasound and is not intended to be a complete ultrasound exam.  Patient also informed that the ultrasound is not being completed with the intent of assessing for fetal or placental anomalies or any pelvic abnormalities.  Explained that the purpose of today?s ultrasound is to assess for fetal heart rate.  Patient acknowledges the purpose of the exam and the limitations of the study. ?General: well-developed, well-nourished female in no acute distress  ?Breasts:  normal appearance, no masses or tenderness bilaterally  ?Skin: normal coloration and turgor, no rashes  ?Neurologic: oriented, normal, negative, normal mood  ?Extremities: normal strength, tone, and  muscle mass, ROM of all joints is normal  ?HEENT PERRLA, extraocular movement intact and sclera clear, anicteric  ?Neck supple and no masses  ?Cardiovascular: regular rate and rhythm  ?Respiratory:  no respiratory distress, normal breath sounds  ?Abdomen: soft, non-tender; bowel sounds normal; no masses,  no organomegaly  ?Pelvic: deferred  ?  ?Assessment:  ?  ?Pregnancy: G2P0010 ?Patient Active Problem List  ? Diagnosis Date Noted  ? Chlamydia infection affecting pregnancy 01/24/2022  ? Gonorrhea affecting pregnancy 01/24/2022  ? 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 Supervision of other normal pregnancy, antepartum 12/27/2021  ? ADHD (attention deficit hyperactivity disorder) 03/29/2013  ? ?  ?Plan:  ?  ?1. [redacted] weeks gestation of pregnancy ?- ABO/Rh ?- Antibody screen ?- CBC ?- Hepatitis B surface antigen ?- HIV Antibody (routine testing w rflx) ?- HIV (Save tube for possible reflex) ?- RPR ?- Rubella screen ?- Hepatitis C antibody ?- Urine Culture ?- Cervicovaginal ancillary only( ) ?- Genetic Screening ?- [place] MFM OB COMP + 14 WK; Future ? ?2. Encounter for supervision of other normal pregnancy in first trimester ?- [place] MFM OB COMP + 43 WK; Future ?- recheck 4 weeks ? ?Initial labs drawn. ?Continue prenatal vitamins. ?Problem list reviewed and updated. ?Genetic Screening discussed, NIPS: ordered. ?Ultrasound discussed; fetal anatomic survey: ordered. ?Anticipatory guidance about prenatal visits given including labs, ultrasounds, and testing. ?Discussed usage of Babyscripts and virtual visits as additional source of managing and completing prenatal visits in midst of coronavirus and pandemic.   ?Encouraged to complete MyChart Registration for her ability to review results, send requests, and have questions addressed.  ?The nature of [place] [facility] with multiple MDs and Advanced Practice Providers was explained to patient; also emphasized that residents,  students are part of our team. ?Routine obstetric precautions reviewed. Encouraged to seek out care at office or emergency room [facility] preferred) for urgent and/or emergent concerns. ?Return in about 4 weeks (around 02/15/2022).  ?  ? ?M. Edwinna Areola, MD, FACOG ?Obstetrician Social research officer, government, Faculty Practice ?Center for [place] ? ? ? ?

## 2022-01-19 ENCOUNTER — Encounter: Payer: Medicaid Other | Admitting: Licensed Clinical Social Worker

## 2022-01-19 LAB — CBC
Hematocrit: 36.8 % (ref 34.0–46.6)
Hemoglobin: 12.5 g/dL (ref 11.1–15.9)
MCH: 29.4 pg (ref 26.6–33.0)
MCHC: 34 g/dL (ref 31.5–35.7)
MCV: 87 fL (ref 79–97)
Platelets: 242 10*3/uL (ref 150–450)
RBC: 4.25 x10E6/uL (ref 3.77–5.28)
RDW: 13 % (ref 11.7–15.4)
WBC: 5.7 10*3/uL (ref 3.4–10.8)

## 2022-01-19 LAB — POCT URINALYSIS DIPSTICK OB
Appearance: NORMAL
Bilirubin, UA: NEGATIVE
Blood, UA: NEGATIVE
Glucose, UA: NEGATIVE
Ketones, UA: NEGATIVE
Nitrite, UA: NEGATIVE
POC,PROTEIN,UA: NEGATIVE
Spec Grav, UA: 1.02 (ref 1.010–1.025)
Urobilinogen, UA: 0.2 E.U./dL
pH, UA: 7 (ref 5.0–8.0)

## 2022-01-19 LAB — ANTIBODY SCREEN: Antibody Screen: NEGATIVE

## 2022-01-19 LAB — CERVICOVAGINAL ANCILLARY ONLY
Chlamydia: POSITIVE — AB
Comment: NEGATIVE
Comment: NORMAL
Neisseria Gonorrhea: POSITIVE — AB

## 2022-01-19 LAB — ABO/RH: Rh Factor: POSITIVE

## 2022-01-19 LAB — HEPATITIS C ANTIBODY: Hep C Virus Ab: NONREACTIVE

## 2022-01-19 LAB — RPR: RPR Ser Ql: NONREACTIVE

## 2022-01-19 LAB — HIV ANTIBODY (ROUTINE TESTING W REFLEX): HIV Screen 4th Generation wRfx: NONREACTIVE

## 2022-01-19 LAB — RUBELLA SCREEN: Rubella Antibodies, IGG: 13 index (ref >0.99)

## 2022-01-19 LAB — HEPATITIS B SURFACE ANTIGEN: Hepatitis B Surface Ag: NEGATIVE

## 2022-01-19 NOTE — Progress Notes (Signed)
New OB Intake ? ?I explained I am completing New OB Intake today. We discussed her EDD of 08/15/22 that is based on LMP of 11/08/22. Pt is G2/P0. I reviewed her allergies, medications, Medical/Surgical/OB history, and appropriate screenings. I informed her of [facility] services. Based on history, this is a/an  pregnancy uncomplicated .  ? ?Patient Active Problem List  ? Diagnosis Date Noted  ? 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 Supervision of other normal pregnancy, antepartum 12/27/2021  ? ? ?Concerns addressed today ? ?Delivery Plans:  ?Plans to deliver at [facility].  ? ?Waterbirth candidate?  ? ?MyChart/Babyscripts ?MyChart access verified. I explained pt will have some visits in office and some virtually. Babyscripts instructions given and order placed. Patient verifies receipt of registration text/e-mail. Account successfully created and app downloaded. ? ?Blood Pressure Cuff  ?BP cuff previously ordered for pt. Pt having trouble getting BP entered into babyscripts app. I reviewed with pt. We will re-enter the order and have pt download the app again to see if that will work. ? ?Anatomy US ?Explained first scheduled [place] will be around 19 weeks. Anatomy US scheduled for 03/21/22 at 1115. Pt no ? ?Labs ?Discussed Avelina Laine genetic screening with patient. Would like both Panorama and Horizon drawn. Reviewed routine prenatal labs that would also be drawn ? ?Covid Vaccine ?Patient has not covid vaccine.  ? ?Social Determinants of Health ?Food Insecurity: Patient denies food insecurity. ?WIC Referral: Patient is interested in referral to [facility].  ?Transportation: Patient denies transportation needs. ? ?Send link to Pregnancy Navigators ?Pt to see Dr. Hyacinth Meeker.  ? ?Placed OB Box on problem list and updated ? ? ? ? ?Harrie Jeans, RN ?01/19/2022  9:02 AM  ?

## 2022-01-20 ENCOUNTER — Other Ambulatory Visit (HOSPITAL_BASED_OUTPATIENT_CLINIC_OR_DEPARTMENT_OTHER): Payer: Self-pay | Admitting: *Deleted

## 2022-01-20 ENCOUNTER — Encounter: Payer: Medicaid Other | Admitting: Licensed Clinical Social Worker

## 2022-01-20 ENCOUNTER — Other Ambulatory Visit: Payer: Self-pay

## 2022-01-20 ENCOUNTER — Encounter (HOSPITAL_BASED_OUTPATIENT_CLINIC_OR_DEPARTMENT_OTHER): Payer: Self-pay | Admitting: Obstetrics & Gynecology

## 2022-01-20 ENCOUNTER — Ambulatory Visit (INDEPENDENT_AMBULATORY_CARE_PROVIDER_SITE_OTHER): Payer: Medicaid Other | Admitting: *Deleted

## 2022-01-20 DIAGNOSIS — O98211 Gonorrhea complicating pregnancy, first trimester: Secondary | ICD-10-CM

## 2022-01-20 LAB — URINE CULTURE: Organism ID, Bacteria: NO GROWTH

## 2022-01-20 MED ORDER — AZITHROMYCIN 250 MG PO TABS: 1000.0000 mg | ORAL_TABLET | Freq: Once | ORAL | 0 refills | Status: AC

## 2022-01-20 MED ORDER — CEFTRIAXONE SODIUM 500 MG IJ SOLR
500.0000 mg | Freq: Once | INTRAMUSCULAR | Status: AC
  Administered 2022-01-20: 15:00:00 500 mg via INTRAMUSCULAR

## 2022-01-20 NOTE — Progress Notes (Addendum)
Pt here for rocephin injection. Pt tolerated well. Monitored for 15 min with no reaction. Pt advised to refrain from intercourse until [facility] has been performed. Repeat test to be done at next routine OB visit. Report faxed to HD ?

## 2022-01-20 NOTE — Progress Notes (Signed)
Pt advised to pick up Rx for azithromycin and bring with her to office where she will receive injection of ceftriaxone.  ?

## 2022-01-24 ENCOUNTER — Encounter (HOSPITAL_BASED_OUTPATIENT_CLINIC_OR_DEPARTMENT_OTHER): Payer: Self-pay | Admitting: Obstetrics & Gynecology

## 2022-01-24 DIAGNOSIS — A749 Chlamydial infection, unspecified: Secondary | ICD-10-CM | POA: Insufficient documentation

## 2022-01-24 DIAGNOSIS — O98219 Gonorrhea complicating pregnancy, unspecified trimester: Secondary | ICD-10-CM | POA: Insufficient documentation

## 2022-02-02 ENCOUNTER — Encounter (HOSPITAL_BASED_OUTPATIENT_CLINIC_OR_DEPARTMENT_OTHER): Payer: Self-pay | Admitting: *Deleted

## 2022-02-02 DIAGNOSIS — Z8619 Personal history of other infectious and parasitic diseases: Secondary | ICD-10-CM | POA: Insufficient documentation

## 2022-02-10 ENCOUNTER — Encounter (HOSPITAL_BASED_OUTPATIENT_CLINIC_OR_DEPARTMENT_OTHER): Payer: Self-pay | Admitting: Medical

## 2022-02-10 ENCOUNTER — Ambulatory Visit (INDEPENDENT_AMBULATORY_CARE_PROVIDER_SITE_OTHER): Payer: Medicaid Other | Admitting: Medical

## 2022-02-10 ENCOUNTER — Other Ambulatory Visit (HOSPITAL_COMMUNITY)
Admission: RE | Admit: 2022-02-10 | Discharge: 2022-02-10 | Disposition: A | Discharge status: Home / Self Care | Payer: Medicaid Other | Source: Ambulatory Visit | Attending: Medical | Admitting: Medical

## 2022-02-10 VITALS — BP 117/61 | HR 66 | Wt 127.6 lb

## 2022-02-10 DIAGNOSIS — Z3482 Encounter for supervision of other normal pregnancy, second trimester: Secondary | ICD-10-CM

## 2022-02-10 DIAGNOSIS — N898 Other specified noninflammatory disorders of vagina: Secondary | ICD-10-CM

## 2022-02-10 DIAGNOSIS — A749 Chlamydial infection, unspecified: Secondary | ICD-10-CM

## 2022-02-10 DIAGNOSIS — O98211 Gonorrhea complicating pregnancy, first trimester: Secondary | ICD-10-CM | POA: Diagnosis not present

## 2022-02-10 DIAGNOSIS — B9689 Other specified bacterial agents as the cause of diseases classified elsewhere: Secondary | ICD-10-CM

## 2022-02-10 DIAGNOSIS — O26892 Other specified pregnancy related conditions, second trimester: Secondary | ICD-10-CM

## 2022-02-10 DIAGNOSIS — Z3480 Encounter for supervision of other normal pregnancy, unspecified trimester: Secondary | ICD-10-CM

## 2022-02-10 DIAGNOSIS — O98811 Other maternal infectious and parasitic diseases complicating pregnancy, first trimester: Secondary | ICD-10-CM

## 2022-02-10 DIAGNOSIS — Z3A13 13 weeks gestation of pregnancy: Secondary | ICD-10-CM

## 2022-02-10 DIAGNOSIS — A6009 Herpesviral infection of other urogenital tract: Secondary | ICD-10-CM

## 2022-02-10 DIAGNOSIS — N760 Acute vaginitis: Secondary | ICD-10-CM

## 2022-02-10 DIAGNOSIS — O26891 Other specified pregnancy related conditions, first trimester: Secondary | ICD-10-CM

## 2022-02-10 NOTE — Progress Notes (Signed)
? ?PRENATAL VISIT NOTE ? ?Subjective:  ?Andrea Vaughn is a 21 y.o. G2P0010 at [redacted]w[redacted]d being seen today for ongoing prenatal care.  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 ? ?Patient reports vaginal irritation and vaginal discharge. Discharge noted x 4 days. Yellow, thick with odor and associated itching and irritation. She denies rash, pain or bleeding.  Contractions: Not present. Vag. Bleeding: None.  Movement: Absent. Denies leaking of fluid.  ? ?The following portions of the patient's history were reviewed and updated as appropriate: allergies, current medications, past family history, past medical history, past social history, past surgical history and problem list.  ? ?Objective:  ? ?Vitals:  ? 02/10/22 0832  ?BP: 117/61  ?Pulse: 66  ?Weight: 127 lb 9.6 oz (57.9 kg)  ? ? ?Fetal Status: Fetal Heart Rate (bpm): 149   Movement: Absent    ? ?General:  Alert, oriented and cooperative. Patient is in no acute distress.  ?Skin: Skin is warm and dry. No rash noted.   ?Cardiovascular: Normal heart rate noted  ?Respiratory: Normal respiratory effort, no problems with respiration noted  ?Abdomen: Soft, gravid, appropriate for gestational age.  Pain/Pressure: Present     ?Pelvic: Cervical exam deferred       scant thin, white discharge noted, no bleeding, LOF, pooling, irritation or erythema noted. Cervix is visually closed  ?Extremities: Normal range of motion.  Edema: None  ?Mental Status: Normal mood and affect. Normal behavior. Normal judgment and thought content.  ? ?Assessment and Plan:  ?Pregnancy: G2P0010 at [redacted]w[redacted]d ?1. Supervision of other normal pregnancy, antepartum ?- Doing well ?- Planning POPs for MOC ?- Has Peds  list, importance of Peds discussed today  ? ?2. 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 ?3. Gonorrhea affecting pregnancy in first trimester ?- Treated 01/20/22, TOC today  ?- Patient states partner treated and no intercourse since treatment  ?- Cervicovaginal ancillary only( [place]) ? ?4. Chlamydia infection affecting pregnancy in first trimester ?- Treated 01/20/22, TOC today  ?- Patient states partner treated and no intercourse since treatment  ?- Cervicovaginal ancillary only( [place]) ? ?5. [redacted] weeks gestation of pregnancy ? ?6. Vaginal discharge during pregnancy in first trimester ?- Cervicovaginal ancillary only( [place]) ? ?Preterm labor symptoms and general obstetric precautions including but not limited to vaginal bleeding, contractions, leaking of fluid and fetal movement were reviewed in detail with the patient. ?Please refer to After Visit Summary for other counseling recommendations.  ? ?Return in about 4 weeks (around 03/10/2022) for LOB, any provider, In-Person. ? ?Future Appointments  ?Date Time Provider [place]  ?03/15/2022 11:15 AM Leftwich-Kirby, Kathie Dike, CNM [facility]  ?03/21/2022 11:30 AM [facility] US2 [facility]  ?04/12/2022 11:00 AM Leftwich-Kirby, Kathie Dike, CNM [facility]  ?05/16/2022 10:15 AM Megan Salon, MD [facility]  ?06/06/2022 11:15 AM Megan Salon, MD [facility]  ?06/28/2022 11:15 AM Megan Salon, MD [facility]  ?07/11/2022 11:00 AM Megan Salon, MD [facility]  ?07/22/2022 11:15 AM Megan Salon, MD [facility]  ?07/29/2022 11:15 AM Megan Salon, MD [facility]  ?08/05/2022 11:15 AM Megan Salon, MD [facility]  ?08/12/2022 11:15 AM Megan Salon, MD [facility]  ?08/19/2022 11:15 AM Hale Bogus  Chauncey Cruel, MD [facility]  ? ? ?Kerry Hough, PA-C ? ?

## 2022-02-11 LAB — CERVICOVAGINAL ANCILLARY ONLY
Bacterial Vaginitis (gardnerella): POSITIVE — AB
Candida Glabrata: NEGATIVE
Candida Vaginitis: NEGATIVE
Chlamydia: NEGATIVE
Comment: NEGATIVE
Comment: NEGATIVE
Comment: NEGATIVE
Comment: NEGATIVE
Comment: NEGATIVE
Comment: NORMAL
Neisseria Gonorrhea: NEGATIVE
Trichomonas: NEGATIVE

## 2022-02-11 MED ORDER — METRONIDAZOLE 500 MG PO TABS
500.0000 mg | ORAL_TABLET | Freq: Two times a day (BID) | ORAL | 0 refills | Status: DC
Start: 2022-02-11 — End: 2022-04-01

## 2022-02-11 NOTE — Addendum Note (Signed)
Addended by: Luvenia Redden on: 02/11/2022 01:25 PM ? ? Modules accepted: Orders ? ?

## 2022-02-17 ENCOUNTER — Encounter (HOSPITAL_BASED_OUTPATIENT_CLINIC_OR_DEPARTMENT_OTHER): Payer: Medicaid Other | Admitting: Obstetrics & Gynecology

## 2022-03-14 NOTE — Progress Notes (Signed)
? ?  PRENATAL VISIT NOTE ? ?Subjective:  ?Andrea Vaughn is a 21 y.o. G2P0010 at [redacted]w[redacted]d being seen today for ongoing prenatal care.  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 ? ?Patient reports no complaints.  Contractions: Not present. Vag. Bleeding: None.  Movement: Absent. Denies leaking of fluid.  ? ?The following portions of the patient's history were reviewed and updated as appropriate: allergies, current medications, past family history, past medical history, past social history, past surgical history and problem list.  ? ?Objective:  ? ?Vitals:  ? 03/15/22 1133  ?BP: 109/64  ?Pulse: 87  ?Weight: 130 lb 12.8 oz (59.3 kg)  ? ? ?Fetal Status: Fetal Heart Rate (bpm): 155   Movement: Absent    ? ?General:  Alert, oriented and cooperative. Patient is in no acute distress.  ?Skin: Skin is warm and dry. No rash noted.   ?Cardiovascular: Normal heart rate noted  ?Respiratory: Normal respiratory effort, no problems with respiration noted  ?Abdomen: Soft, gravid, appropriate for gestational age.  Pain/Pressure: Absent     ?Pelvic: Cervical exam deferred        ?Extremities: Normal range of motion.  Edema: None  ?Mental Status: Normal mood and affect. Normal behavior. Normal judgment and thought content.  ? ?Assessment and Plan:  ?Pregnancy: G2P0010 at [redacted]w[redacted]d ?1. Supervision of other normal pregnancy, antepartum ?--Anticipatory guidance about next visits/weeks of pregnancy given.  ?--AFP today or pt may return for lab only visit ? ?2. [redacted] weeks gestation of pregnancy ? ? ?Preterm labor symptoms and general obstetric precautions including but not limited to vaginal bleeding, contractions, leaking of fluid and fetal movement were  reviewed in detail with the patient. ?Please refer to After Visit Summary for other counseling recommendations.  ? ?No follow-ups on file. ? ?Future Appointments  ?Date Time Provider Department Center  ?03/21/2022 11:30 AM [facility] US2 [facility]  ?04/12/2022 12:00 PM Jerene Bears, MD [facility]  ?05/16/2022 10:15 AM Jerene Bears, MD [facility]  ?06/06/2022 11:15 AM Jerene Bears, MD [facility]  ?06/28/2022 11:15 AM Jerene Bears, MD [facility]  ?07/11/2022 11:00 AM Jerene Bears, MD [facility]  ?07/22/2022 11:15 AM Jerene Bears, MD [facility]  ?07/29/2022 11:15 AM Jerene Bears, MD [facility]  ?08/05/2022 11:15 AM Jerene Bears, MD [facility]  ?08/12/2022 11:15 AM Jerene Bears, MD [facility]  ?08/19/2022 11:15 AM Jerene Bears, MD [facility]  ? ? ?Sharen Counter, CNM  ?

## 2022-03-15 ENCOUNTER — Ambulatory Visit (INDEPENDENT_AMBULATORY_CARE_PROVIDER_SITE_OTHER): Payer: Medicaid Other | Admitting: Advanced Practice Midwife

## 2022-03-15 VITALS — BP 109/64 | HR 87 | Wt 130.8 lb

## 2022-03-15 DIAGNOSIS — Z3480 Encounter for supervision of other normal pregnancy, unspecified trimester: Secondary | ICD-10-CM

## 2022-03-15 DIAGNOSIS — Z3482 Encounter for supervision of other normal pregnancy, second trimester: Secondary | ICD-10-CM

## 2022-03-15 DIAGNOSIS — Z3A18 18 weeks gestation of pregnancy: Secondary | ICD-10-CM

## 2022-03-21 ENCOUNTER — Other Ambulatory Visit: Payer: Self-pay | Admitting: *Deleted

## 2022-03-21 ENCOUNTER — Ambulatory Visit: Payer: Medicaid Other | Attending: Obstetrics & Gynecology

## 2022-03-21 DIAGNOSIS — Z3A10 10 weeks gestation of pregnancy: Secondary | ICD-10-CM

## 2022-03-21 DIAGNOSIS — Z8619 Personal history of other infectious and parasitic diseases: Secondary | ICD-10-CM | POA: Insufficient documentation

## 2022-03-21 DIAGNOSIS — Z3A19 19 weeks gestation of pregnancy: Secondary | ICD-10-CM | POA: Diagnosis not present

## 2022-03-21 DIAGNOSIS — Z363 Encounter for antenatal screening for malformations: Secondary | ICD-10-CM | POA: Insufficient documentation

## 2022-03-21 DIAGNOSIS — O283 Abnormal ultrasonic finding on antenatal screening of mother: Secondary | ICD-10-CM

## 2022-03-21 DIAGNOSIS — Z3481 Encounter for supervision of other normal pregnancy, first trimester: Secondary | ICD-10-CM | POA: Diagnosis not present

## 2022-03-21 DIAGNOSIS — O359XX0 Maternal care for (suspected) fetal abnormality and damage, unspecified, not applicable or unspecified: Secondary | ICD-10-CM

## 2022-03-21 DIAGNOSIS — Z3480 Encounter for supervision of other normal pregnancy, unspecified trimester: Secondary | ICD-10-CM

## 2022-03-22 ENCOUNTER — Other Ambulatory Visit (HOSPITAL_BASED_OUTPATIENT_CLINIC_OR_DEPARTMENT_OTHER): Payer: Medicaid Other

## 2022-03-22 DIAGNOSIS — Z3482 Encounter for supervision of other normal pregnancy, second trimester: Secondary | ICD-10-CM

## 2022-03-22 DIAGNOSIS — Z3480 Encounter for supervision of other normal pregnancy, unspecified trimester: Secondary | ICD-10-CM

## 2022-03-24 LAB — AFP, SERUM, OPEN SPINA BIFIDA
AFP MoM: 1.22
AFP Value: 74.9 ng/mL
Gest. Age on Collection Date: 19 weeks
Maternal Age At EDD: 20.9 yr
OSBR Risk 1 IN: 10000
Test Results:: NEGATIVE
Weight: 131 [lb_av]

## 2022-03-31 ENCOUNTER — Encounter (HOSPITAL_BASED_OUTPATIENT_CLINIC_OR_DEPARTMENT_OTHER): Payer: Self-pay | Admitting: Obstetrics & Gynecology

## 2022-04-01 ENCOUNTER — Ambulatory Visit (INDEPENDENT_AMBULATORY_CARE_PROVIDER_SITE_OTHER): Payer: Medicaid Other | Admitting: Obstetrics & Gynecology

## 2022-04-01 ENCOUNTER — Other Ambulatory Visit (HOSPITAL_COMMUNITY)
Admission: RE | Admit: 2022-04-01 | Discharge: 2022-04-01 | Disposition: A | Discharge status: Home / Self Care | Payer: Medicaid Other | Source: Ambulatory Visit | Attending: Obstetrics & Gynecology | Admitting: Obstetrics & Gynecology

## 2022-04-01 VITALS — BP 127/80 | HR 81 | Wt 134.8 lb

## 2022-04-01 DIAGNOSIS — Z8619 Personal history of other infectious and parasitic diseases: Secondary | ICD-10-CM

## 2022-04-01 DIAGNOSIS — Z3482 Encounter for supervision of other normal pregnancy, second trimester: Secondary | ICD-10-CM

## 2022-04-01 DIAGNOSIS — O26892 Other specified pregnancy related conditions, second trimester: Secondary | ICD-10-CM | POA: Insufficient documentation

## 2022-04-01 DIAGNOSIS — O98811 Other maternal infectious and parasitic diseases complicating pregnancy, first trimester: Secondary | ICD-10-CM

## 2022-04-01 DIAGNOSIS — N760 Acute vaginitis: Secondary | ICD-10-CM

## 2022-04-01 DIAGNOSIS — N898 Other specified noninflammatory disorders of vagina: Secondary | ICD-10-CM | POA: Diagnosis present

## 2022-04-01 DIAGNOSIS — Z3480 Encounter for supervision of other normal pregnancy, unspecified trimester: Secondary | ICD-10-CM

## 2022-04-01 DIAGNOSIS — O98211 Gonorrhea complicating pregnancy, first trimester: Secondary | ICD-10-CM

## 2022-04-01 DIAGNOSIS — Z3A20 20 weeks gestation of pregnancy: Secondary | ICD-10-CM

## 2022-04-01 DIAGNOSIS — A749 Chlamydial infection, unspecified: Secondary | ICD-10-CM

## 2022-04-01 MED ORDER — METRONIDAZOLE 500 MG PO TABS: 500.0000 mg | ORAL_TABLET | Freq: Two times a day (BID) | ORAL | 0 refills | Status: DC

## 2022-04-04 ENCOUNTER — Encounter (HOSPITAL_BASED_OUTPATIENT_CLINIC_OR_DEPARTMENT_OTHER): Payer: Self-pay | Admitting: Obstetrics & Gynecology

## 2022-04-04 LAB — CERVICOVAGINAL ANCILLARY ONLY
Bacterial Vaginitis (gardnerella): POSITIVE — AB
Candida Glabrata: POSITIVE — AB
Candida Vaginitis: NEGATIVE
Chlamydia: NEGATIVE
Comment: NEGATIVE
Comment: NEGATIVE
Comment: NEGATIVE
Comment: NEGATIVE
Comment: NEGATIVE
Comment: NORMAL
Neisseria Gonorrhea: NEGATIVE
Trichomonas: NEGATIVE

## 2022-04-04 NOTE — Progress Notes (Signed)
   PRENATAL VISIT NOTE  Subjective:  Andrea Vaughn is a 21 y.o. G2P0010 at [redacted]w[redacted]d being seen today for complaint of vaginal discharge with odor.  H/o chlamydia and gonorrhea at beginning of pregnancy.  Pt called for appt due to symptoms as this is not a routine appt.  Last seen 03/15/2022.  Denies vaginal bleeding.  Denies pelvic pain.   Contractions: Not present. Vag. Bleeding: None.  Movement: Present. Denies leaking of fluid.   The following portions of the patient's history were reviewed and updated as appropriate: allergies, current medications, past family history, past medical history, past social history, past surgical history and problem list.   Objective:   Vitals:   04/01/22 1235  BP: 127/80  Pulse: 81  Weight: 134 lb 12.8 oz (61.1 kg)    Fetal Status: Fetal Heart Rate (bpm): 160   Movement: Present     General:  Alert, oriented and cooperative. Patient is in no acute distress.  Skin: Skin is warm and dry. No rash noted.   Cardiovascular: Normal heart rate noted  Respiratory: Normal respiratory effort, no problems with respiration noted  Abdomen: Soft, gravid, appropriate for gestational age.  Pain/Pressure: Present     Pelvic: NAEFG, vaginal erythematous with discharge present, no lesions         Extremities: Normal range of motion.  Edema: None  Mental Status: Normal mood and affect. Normal behavior. Normal judgment and thought content.   Assessment and Plan:  Pregnancy: G2P0010 at [redacted]w[redacted]d 1. Supervision of other normal pregnancy, antepartum - on PNV and baby ASA - routine ob appt scheduled in less than 2 weeks  2. [redacted] weeks gestation of pregnancy  3. Vaginal discharge during pregnancy in second trimester - metroNIDAZOLE (FLAGYL) 500 MG tablet; Take 1 tablet (500 mg total) by mouth 2 (two) times daily.  Dispense: 14 tablet; Refill: 0 - Cervicovaginal ancillary only( [place])  4. History of herpes genitalis - will reated around 35 weeks  5. Chlamydia  infection affecting pregnancy in first trimester - was treated  6. Gonorrhea affecting pregnancy in first trimester - was treated   Preterm labor symptoms and general obstetric precautions including but not limited to vaginal bleeding, contractions, leaking of fluid and fetal movement were reviewed in detail with the patient. Please refer to After Visit Summary for other counseling recommendations.   Return in about 2 weeks (around 04/15/2022).  Future Appointments  Date Time Provider Department Center  04/12/2022 12:00 PM Jerene Bears, MD [facility]  04/19/2022  2:30 PM [facility] NURSE [facility]  04/19/2022  2:45 PM [facility] US5 [facility]  05/16/2022 10:15 AM Jerene Bears, MD [facility]  06/06/2022 11:15 AM Jerene Bears, MD [facility]  06/27/2022 11:00 AM Jerene Bears, MD [facility]  07/11/2022 11:00 AM Jerene Bears, MD [facility]  07/22/2022 11:15 AM Jerene Bears, MD [facility]  07/29/2022 11:15 AM Jerene Bears, MD [facility]  08/05/2022 11:15 AM Jerene Bears, MD [facility]  08/12/2022 11:15 AM Jerene Bears, MD [facility]  08/19/2022 11:15 AM Jerene Bears, MD [facility]    Jerene Bears, MD

## 2022-04-05 ENCOUNTER — Encounter (HOSPITAL_BASED_OUTPATIENT_CLINIC_OR_DEPARTMENT_OTHER): Payer: Self-pay | Admitting: Obstetrics & Gynecology

## 2022-04-05 ENCOUNTER — Other Ambulatory Visit (HOSPITAL_BASED_OUTPATIENT_CLINIC_OR_DEPARTMENT_OTHER): Payer: Self-pay | Admitting: Obstetrics & Gynecology

## 2022-04-05 MED ORDER — TERCONAZOLE 0.4 % VA CREA: 1.0000 | TOPICAL_CREAM | Freq: Every day | VAGINAL | 0 refills | Status: DC

## 2022-04-12 ENCOUNTER — Encounter (HOSPITAL_BASED_OUTPATIENT_CLINIC_OR_DEPARTMENT_OTHER): Payer: Self-pay | Admitting: Obstetrics & Gynecology

## 2022-04-12 ENCOUNTER — Ambulatory Visit (INDEPENDENT_AMBULATORY_CARE_PROVIDER_SITE_OTHER): Payer: Medicaid Other | Admitting: Obstetrics & Gynecology

## 2022-04-12 VITALS — BP 118/82 | HR 78 | Wt 135.0 lb

## 2022-04-12 DIAGNOSIS — Z3482 Encounter for supervision of other normal pregnancy, second trimester: Secondary | ICD-10-CM

## 2022-04-12 DIAGNOSIS — O98211 Gonorrhea complicating pregnancy, first trimester: Secondary | ICD-10-CM

## 2022-04-12 DIAGNOSIS — O283 Abnormal ultrasonic finding on antenatal screening of mother: Secondary | ICD-10-CM

## 2022-04-12 DIAGNOSIS — A749 Chlamydial infection, unspecified: Secondary | ICD-10-CM

## 2022-04-12 DIAGNOSIS — Z8619 Personal history of other infectious and parasitic diseases: Secondary | ICD-10-CM

## 2022-04-12 DIAGNOSIS — Z3480 Encounter for supervision of other normal pregnancy, unspecified trimester: Secondary | ICD-10-CM

## 2022-04-12 DIAGNOSIS — O98812 Other maternal infectious and parasitic diseases complicating pregnancy, second trimester: Secondary | ICD-10-CM

## 2022-04-15 NOTE — Progress Notes (Signed)
   PRENATAL VISIT NOTE  Subjective:  Andrea Vaughn is a 21 y.o. G2P0010 at [redacted]w[redacted]d being seen today for ongoing prenatal care.  She is currently monitored for the following issues for this low-risk pregnancy and has Supervision of other normal pregnancy, antepartum; ADHD (attention deficit hyperactivity disorder); Chlamydia infection affecting pregnancy; Gonorrhea affecting pregnancy; History of herpes genitalis; and Abnormal fetal ultrasound on their problem list.  Patient reports no complaints.  Contractions: Not present. Vag. Bleeding: None.  Movement: Present. Denies leaking of fluid.   The following portions of the patient's history were reviewed and updated as appropriate: allergies, current medications, past family history, past medical history, past social history, past surgical history and problem list.   Objective:   Vitals:   04/14/22 0812  BP: 118/82  Pulse: 78  Weight: 135 lb (61.2 kg)    Fetal Status: Fetal Heart Rate (bpm): 156   Movement: Present     General:  Alert, oriented and cooperative. Patient is in no acute distress.  Skin: Skin is warm and dry. No rash noted.   Cardiovascular: Normal heart rate noted  Respiratory: Normal respiratory effort, no problems with respiration noted  Abdomen: Soft, gravid, appropriate for gestational age.  Pain/Pressure: Absent     Pelvic: Cervical exam deferred        Extremities: Normal range of motion.  Edema: None  Mental Status: Normal mood and affect. Normal behavior. Normal judgment and thought content.   Assessment and Plan:  Pregnancy: G2P0010 at [redacted]w[redacted]d 1. Supervision of other normal pregnancy, antepartum - on PNV and iron - recheck 4 weeks.  Will plan GTT, 3rd trimester labs and Tdap at that visit.  2. History of herpes genitalis - will treat last month of pregnancy  3. Chlamydia infection affecting pregnancy in first trimester - treated with negative TOC  4. Gonorrhea affecting pregnancy in first trimester -  treated with negative TOD  5. Abnormal fetal ultrasound - cyst behind cerebellum noted.  Has follow up scan next week scheduled on 04/19/2022.  Preterm labor symptoms and general obstetric precautions including but not limited to vaginal bleeding, contractions, leaking of fluid and fetal movement were reviewed in detail with the patient. Please refer to After Visit Summary for other counseling recommendations.   Return in about 4 weeks (around 05/10/2022).  Future Appointments  Date Time Provider [place]  04/19/2022  2:30 PM [facility] NURSE [facility]  04/19/2022  2:45 PM [facility] US5 [facility]  05/16/2022 10:15 AM Megan Salon, MD [facility]  06/06/2022 11:15 AM Megan Salon, MD [facility]  06/27/2022 11:00 AM Megan Salon, MD [facility]  07/11/2022 11:00 AM Megan Salon, MD [facility]  07/22/2022 11:15 AM Megan Salon, MD [facility]  07/29/2022 11:15 AM Megan Salon, MD [facility]  08/05/2022 11:15 AM Megan Salon, MD [facility]  08/12/2022 11:15 AM Megan Salon, MD [facility]  08/19/2022 11:15 AM Megan Salon, MD [facility]    Megan Salon, MD

## 2022-04-19 ENCOUNTER — Ambulatory Visit: Payer: Medicaid Other

## 2022-04-22 ENCOUNTER — Other Ambulatory Visit: Payer: Self-pay | Admitting: *Deleted

## 2022-04-22 ENCOUNTER — Ambulatory Visit: Payer: Medicaid Other | Admitting: *Deleted

## 2022-04-22 ENCOUNTER — Ambulatory Visit: Payer: Medicaid Other | Attending: Obstetrics

## 2022-04-22 ENCOUNTER — Encounter (HOSPITAL_BASED_OUTPATIENT_CLINIC_OR_DEPARTMENT_OTHER): Payer: Self-pay | Admitting: Obstetrics & Gynecology

## 2022-04-22 ENCOUNTER — Encounter: Payer: Self-pay | Admitting: *Deleted

## 2022-04-22 VITALS — BP 117/65 | HR 72

## 2022-04-22 DIAGNOSIS — O283 Abnormal ultrasonic finding on antenatal screening of mother: Secondary | ICD-10-CM | POA: Insufficient documentation

## 2022-04-22 DIAGNOSIS — Z3A23 23 weeks gestation of pregnancy: Secondary | ICD-10-CM

## 2022-04-22 DIAGNOSIS — O3509X0 Maternal care for (suspected) other central nervous system malformation or damage in fetus, not applicable or unspecified: Secondary | ICD-10-CM

## 2022-04-25 ENCOUNTER — Ambulatory Visit (HOSPITAL_BASED_OUTPATIENT_CLINIC_OR_DEPARTMENT_OTHER): Payer: Medicaid Other

## 2022-04-26 ENCOUNTER — Ambulatory Visit (INDEPENDENT_AMBULATORY_CARE_PROVIDER_SITE_OTHER): Payer: Medicaid Other | Admitting: *Deleted

## 2022-04-26 ENCOUNTER — Other Ambulatory Visit (HOSPITAL_COMMUNITY)
Admission: RE | Admit: 2022-04-26 | Discharge: 2022-04-26 | Disposition: A | Discharge status: Home / Self Care | Payer: Medicaid Other | Source: Ambulatory Visit | Attending: Obstetrics & Gynecology | Admitting: Obstetrics & Gynecology

## 2022-04-26 VITALS — BP 116/67 | HR 81

## 2022-04-26 DIAGNOSIS — N898 Other specified noninflammatory disorders of vagina: Secondary | ICD-10-CM | POA: Diagnosis present

## 2022-04-26 DIAGNOSIS — O26892 Other specified pregnancy related conditions, second trimester: Secondary | ICD-10-CM | POA: Diagnosis present

## 2022-04-26 DIAGNOSIS — Z3480 Encounter for supervision of other normal pregnancy, unspecified trimester: Secondary | ICD-10-CM

## 2022-04-26 NOTE — Progress Notes (Signed)
Pt presents to the office today with complaints of yellow/green vaginal discharge for the last few days. She denies itching. Pt also requests to be tested for STDs. She states that her partner was tested and his results were negative but with the new onset of discharge, she would like to be certain that she is negative as well. Self-swab instructions given and aptima swab obtained. Advised that we would contact her with results and recommendations. Pt verbalized understanding.

## 2022-04-27 ENCOUNTER — Other Ambulatory Visit (HOSPITAL_BASED_OUTPATIENT_CLINIC_OR_DEPARTMENT_OTHER): Payer: Self-pay | Admitting: *Deleted

## 2022-04-27 LAB — CERVICOVAGINAL ANCILLARY ONLY
Bacterial Vaginitis (gardnerella): POSITIVE — AB
Candida Glabrata: NEGATIVE
Candida Vaginitis: NEGATIVE
Chlamydia: NEGATIVE
Comment: NEGATIVE
Comment: NEGATIVE
Comment: NEGATIVE
Comment: NEGATIVE
Comment: NEGATIVE
Comment: NORMAL
Neisseria Gonorrhea: NEGATIVE
Trichomonas: NEGATIVE

## 2022-04-27 MED ORDER — METRONIDAZOLE 500 MG PO TABS: 500.0000 mg | ORAL_TABLET | Freq: Two times a day (BID) | ORAL | 0 refills | Status: DC

## 2022-04-27 NOTE — Progress Notes (Signed)
Rx sent to pharmacy for flagyl to treat positive BV on aptima swab

## 2022-04-29 ENCOUNTER — Other Ambulatory Visit (HOSPITAL_BASED_OUTPATIENT_CLINIC_OR_DEPARTMENT_OTHER): Payer: Self-pay | Admitting: Obstetrics & Gynecology

## 2022-05-04 ENCOUNTER — Encounter (HOSPITAL_BASED_OUTPATIENT_CLINIC_OR_DEPARTMENT_OTHER): Payer: Self-pay | Admitting: Obstetrics & Gynecology

## 2022-05-16 ENCOUNTER — Ambulatory Visit (INDEPENDENT_AMBULATORY_CARE_PROVIDER_SITE_OTHER): Payer: Medicaid Other | Admitting: Obstetrics & Gynecology

## 2022-05-16 VITALS — BP 115/68 | HR 88 | Wt 149.0 lb

## 2022-05-16 DIAGNOSIS — O283 Abnormal ultrasonic finding on antenatal screening of mother: Secondary | ICD-10-CM

## 2022-05-16 DIAGNOSIS — Z3A27 27 weeks gestation of pregnancy: Secondary | ICD-10-CM

## 2022-05-16 DIAGNOSIS — Z23 Encounter for immunization: Secondary | ICD-10-CM | POA: Diagnosis not present

## 2022-05-16 DIAGNOSIS — Z3480 Encounter for supervision of other normal pregnancy, unspecified trimester: Secondary | ICD-10-CM

## 2022-05-16 DIAGNOSIS — Z3482 Encounter for supervision of other normal pregnancy, second trimester: Secondary | ICD-10-CM

## 2022-05-16 DIAGNOSIS — Z8619 Personal history of other infectious and parasitic diseases: Secondary | ICD-10-CM

## 2022-05-16 NOTE — Progress Notes (Signed)
   PRENATAL VISIT NOTE  Subjective:  Andrea Vaughn is a 21 y.o. G2P0010 at [redacted]w[redacted]d being seen today for ongoing prenatal care.  She is currently monitored for the following issues for this low-risk pregnancy and has Supervision of other normal pregnancy, antepartum; ADHD (attention deficit hyperactivity disorder); Chlamydia infection affecting pregnancy; Gonorrhea affecting pregnancy; History of herpes genitalis; and Abnormal fetal ultrasound on their problem list.  Patient reports no complaints.  Contractions: Not present. Vag. Bleeding: None.  Movement: Present. Denies leaking of fluid.   The following portions of the patient's history were reviewed and updated as appropriate: allergies, current medications, past family history, past medical history, past social history, past surgical history and problem list.   Objective:   Vitals:   05/16/22 0820  BP: 115/68  Pulse: 88  Weight: 149 lb (67.6 kg)    Fetal Status: Fetal Heart Rate (bpm): 157 Fundal Height: 29 cm Movement: Present     General:  Alert, oriented and cooperative. Patient is in no acute distress.  Skin: Skin is warm and dry. No rash noted.   Cardiovascular: Normal heart rate noted  Respiratory: Normal respiratory effort, no problems with respiration noted  Abdomen: Soft, gravid, appropriate for gestational age.  Pain/Pressure: Absent     Pelvic: Cervical exam deferred        Extremities: Normal range of motion.  Edema: None  Mental Status: Normal mood and affect. Normal behavior. Normal judgment and thought content.   Assessment and Plan:  Pregnancy: G2P0010 at [redacted]w[redacted]d 1. [redacted] weeks gestation of pregnancy - pt ate this morning so will need to return for 2hr GTT/CBC/RPR - Tdap today - repeat appt scheduled for routine OB appt in about 4 weeks - on PNV and baby ASA  2. History of herpes genitalis - will treat after 36 weeks  3. Abnormal fetal ultrasound - follow up u/s showed mildly enlarged cisterna magna but no  dedicated follow up needed  4. Supervision of other normal pregnancy, antepartum  Preterm labor symptoms and general obstetric precautions including but not limited to vaginal bleeding, contractions, leaking of fluid and fetal movement were reviewed in detail with the patient. Please refer to After Visit Summary for other counseling recommendations.   Return in about 4 weeks (around 06/13/2022).  Future Appointments  Date Time Provider Department Center  05/16/2022 10:15 AM Jerene Bears, MD [facility]  06/06/2022 11:15 AM Jerene Bears, MD [facility]  06/13/2022 10:45 AM [facility] NURSE [facility]  06/13/2022 11:00 AM [facility] US1 [facility]  06/27/2022 11:00 AM Jerene Bears, MD [facility]  07/11/2022 11:00 AM Jerene Bears, MD [facility]  07/22/2022 11:15 AM Jerene Bears, MD [facility]  07/29/2022 11:15 AM Jerene Bears, MD [facility]  08/05/2022 11:15 AM Jerene Bears, MD [facility]  08/12/2022 11:15 AM Jerene Bears, MD [facility]  08/19/2022 11:15 AM Jerene Bears, MD [facility]    Jerene Bears, MD

## 2022-05-16 NOTE — Addendum Note (Signed)
Addended by: Harrie Jeans on: 05/16/2022 08:55 AM   Modules accepted: Orders

## 2022-05-18 ENCOUNTER — Encounter (HOSPITAL_BASED_OUTPATIENT_CLINIC_OR_DEPARTMENT_OTHER): Payer: Self-pay | Admitting: *Deleted

## 2022-05-23 ENCOUNTER — Encounter (HOSPITAL_BASED_OUTPATIENT_CLINIC_OR_DEPARTMENT_OTHER): Payer: Self-pay | Admitting: Obstetrics & Gynecology

## 2022-05-26 ENCOUNTER — Other Ambulatory Visit (HOSPITAL_BASED_OUTPATIENT_CLINIC_OR_DEPARTMENT_OTHER): Payer: Medicaid Other

## 2022-05-26 DIAGNOSIS — Z3482 Encounter for supervision of other normal pregnancy, second trimester: Secondary | ICD-10-CM

## 2022-05-26 DIAGNOSIS — O24410 Gestational diabetes mellitus in pregnancy, diet controlled: Secondary | ICD-10-CM

## 2022-05-27 LAB — RPR: RPR Ser Ql: NONREACTIVE

## 2022-05-27 LAB — CBC
Hematocrit: 31.8 % — ABNORMAL LOW (ref 34.0–46.6)
Hemoglobin: 11.1 g/dL (ref 11.1–15.9)
MCH: 31.3 pg (ref 26.6–33.0)
MCHC: 34.9 g/dL (ref 31.5–35.7)
MCV: 90 fL (ref 79–97)
Platelets: 196 10*3/uL (ref 150–450)
RBC: 3.55 x10E6/uL — ABNORMAL LOW (ref 3.77–5.28)
RDW: 12.8 % (ref 11.7–15.4)
WBC: 7 10*3/uL (ref 3.4–10.8)

## 2022-05-27 LAB — GLUCOSE TOLERANCE, 2 HOURS W/ 1HR
Glucose, 1 hour: 85 mg/dL (ref 70–179)
Glucose, 2 hour: 98 mg/dL (ref 70–152)
Glucose, Fasting: 102 mg/dL — ABNORMAL HIGH (ref 70–91)

## 2022-05-27 LAB — HIV ANTIBODY (ROUTINE TESTING W REFLEX): HIV Screen 4th Generation wRfx: NONREACTIVE

## 2022-05-27 NOTE — Addendum Note (Signed)
Addended by: Jerene Bears on: 05/27/2022 02:35 PM   Modules accepted: Orders

## 2022-05-30 ENCOUNTER — Other Ambulatory Visit (HOSPITAL_BASED_OUTPATIENT_CLINIC_OR_DEPARTMENT_OTHER): Payer: Self-pay | Admitting: *Deleted

## 2022-05-30 DIAGNOSIS — O24419 Gestational diabetes mellitus in pregnancy, unspecified control: Secondary | ICD-10-CM

## 2022-05-30 MED ORDER — ACCU-CHEK SOFTCLIX LANCETS MISC: 12 refills | Status: DC

## 2022-05-30 MED ORDER — ACCU-CHEK GUIDE ME W/DEVICE KIT: 1.0000 | PACK | Freq: Once | 0 refills | Status: DC

## 2022-05-30 MED ORDER — FREESTYLE LITE TEST VI STRP: ORAL_STRIP | 12 refills | Status: DC

## 2022-05-30 MED ORDER — FREESTYLE LITE W/DEVICE KIT: 1.0000 | PACK | Freq: Once | 0 refills | Status: DC

## 2022-05-30 MED ORDER — ACCU-CHEK GUIDE VI STRP: ORAL_STRIP | 12 refills | Status: DC

## 2022-05-30 MED ORDER — FREESTYLE LANCETS MISC: 12 refills | Status: DC

## 2022-05-30 NOTE — Progress Notes (Signed)
Rx sent to pharmacy for glucometer, test strips, and lancets

## 2022-05-30 NOTE — Addendum Note (Signed)
Addended by: Harrie Jeans on: 05/30/2022 11:21 AM   Modules accepted: Orders

## 2022-06-06 ENCOUNTER — Ambulatory Visit (INDEPENDENT_AMBULATORY_CARE_PROVIDER_SITE_OTHER): Payer: Medicaid Other | Admitting: Obstetrics & Gynecology

## 2022-06-06 VITALS — BP 122/71 | HR 98 | Wt 155.4 lb

## 2022-06-06 DIAGNOSIS — O98811 Other maternal infectious and parasitic diseases complicating pregnancy, first trimester: Secondary | ICD-10-CM

## 2022-06-06 DIAGNOSIS — O98211 Gonorrhea complicating pregnancy, first trimester: Secondary | ICD-10-CM

## 2022-06-06 DIAGNOSIS — R7309 Other abnormal glucose: Secondary | ICD-10-CM

## 2022-06-06 DIAGNOSIS — Z3480 Encounter for supervision of other normal pregnancy, unspecified trimester: Secondary | ICD-10-CM

## 2022-06-06 DIAGNOSIS — Z3A30 30 weeks gestation of pregnancy: Secondary | ICD-10-CM

## 2022-06-06 DIAGNOSIS — Z8619 Personal history of other infectious and parasitic diseases: Secondary | ICD-10-CM

## 2022-06-06 DIAGNOSIS — A749 Chlamydial infection, unspecified: Secondary | ICD-10-CM

## 2022-06-06 NOTE — Progress Notes (Signed)
   PRENATAL VISIT NOTE  Subjective:  Andrea Vaughn is a 21 y.o. G2P0010 at [redacted]w[redacted]d being seen today for ongoing prenatal care.  She is currently monitored for the following issues for this low-risk pregnancy and has Supervision of other normal pregnancy, antepartum; ADHD (attention deficit hyperactivity disorder); Chlamydia infection affecting pregnancy; Gonorrhea affecting pregnancy; History of herpes genitalis; and Abnormal fetal ultrasound on their problem list.  Pt has initial value with GTT elevated.  Pt admits she ate at 4am prior to the test.  Is willing to repeat this.  Her mother is with her today.  Discussed importance of following recommendations about how to do test properly.    Patient reports no complaints.  Contractions: Not present. Vag. Bleeding: None.  Movement: Present. Denies leaking of fluid.   The following portions of the patient's history were reviewed and updated as appropriate: allergies, current medications, past family history, past medical history, past social history, past surgical history and problem list.   Objective:   Vitals:   06/06/22 1143  BP: 122/71  Pulse: 98  Weight: 155 lb 6.4 oz (70.5 kg)    Fetal Status: Fetal Heart Rate (bpm): 142   Movement: Present     General:  Alert, oriented and cooperative. Patient is in no acute distress.  Skin: Skin is warm and dry. No rash noted.   Cardiovascular: Normal heart rate noted  Respiratory: Normal respiratory effort, no problems with respiration noted  Abdomen: Soft, gravid, appropriate for gestational age.  Pain/Pressure: Absent     Pelvic: Cervical exam deferred        Extremities: Normal range of motion.  Edema: None  Mental Status: Normal mood and affect. Normal behavior. Normal judgment and thought content.   Assessment and Plan:  Pregnancy: G2P0010 at [redacted]w[redacted]d 1. Supervision of other normal pregnancy, antepartum - on PNV and baby ASA  2. [redacted] weeks gestation of pregnancy - follow up 3 weeks  3.  Abnormal GTT (glucose tolerance test) - pt states ate at 4am morning of testing.  Desires repeating testing.  If still abnormal, will proceed with referral to diabetic educations  4. History of herpes genitalis - will treat at 36 weeks  5. Chlamydia infection affecting pregnancy in first trimester - treated and f/u testing negative  6. Gonorrhea affecting pregnancy in first trimester - treated and follow up testing negative   Preterm labor symptoms and general obstetric precautions including but not limited to vaginal bleeding, contractions, leaking of fluid and fetal movement were reviewed in detail with the patient. Please refer to After Visit Summary for other counseling recommendations.    Future Appointments  Date Time Provider Department Center  06/09/2022  8:15 AM [facility] O[facility]YN [facility]  06/13/2022 10:45 AM [facility] NURSE [facility]  06/13/2022 11:00 AM [facility] US1 [facility]  06/27/2022 11:00 AM Jerene Bears, MD [facility]  07/11/2022 11:00 AM Jerene Bears, MD [facility]  07/22/2022 11:15 AM Jerene Bears, MD [facility]  07/29/2022 11:15 AM Jerene Bears, MD [facility]  08/05/2022 11:15 AM Jerene Bears, MD [facility]  08/12/2022 11:15 AM Jerene Bears, MD [facility]  08/19/2022 11:15 AM Jerene Bears, MD [facility]    Jerene Bears, MD

## 2022-06-09 ENCOUNTER — Other Ambulatory Visit (HOSPITAL_BASED_OUTPATIENT_CLINIC_OR_DEPARTMENT_OTHER): Payer: Medicaid Other

## 2022-06-09 DIAGNOSIS — R7309 Other abnormal glucose: Secondary | ICD-10-CM

## 2022-06-10 LAB — GLUCOSE TOLERANCE, 2 HOURS W/ 1HR
Glucose, 1 hour: 127 mg/dL (ref 70–179)
Glucose, 2 hour: 101 mg/dL (ref 70–152)
Glucose, Fasting: 67 mg/dL — ABNORMAL LOW (ref 70–91)

## 2022-06-10 NOTE — Addendum Note (Signed)
Addended by: Harrie Jeans on: 06/10/2022 09:16 AM   Modules accepted: Orders

## 2022-06-13 ENCOUNTER — Ambulatory Visit: Payer: Medicaid Other | Attending: Obstetrics and Gynecology

## 2022-06-13 ENCOUNTER — Ambulatory Visit: Payer: Medicaid Other | Admitting: *Deleted

## 2022-06-13 ENCOUNTER — Other Ambulatory Visit: Payer: Self-pay | Admitting: *Deleted

## 2022-06-13 DIAGNOSIS — O3500X0 Maternal care for (suspected) central nervous system malformation or damage in fetus, unspecified, not applicable or unspecified: Secondary | ICD-10-CM

## 2022-06-13 DIAGNOSIS — Z3A31 31 weeks gestation of pregnancy: Secondary | ICD-10-CM

## 2022-06-13 DIAGNOSIS — O283 Abnormal ultrasonic finding on antenatal screening of mother: Secondary | ICD-10-CM | POA: Diagnosis present

## 2022-06-13 DIAGNOSIS — Q048 Other specified congenital malformations of brain: Secondary | ICD-10-CM

## 2022-06-23 ENCOUNTER — Other Ambulatory Visit (HOSPITAL_BASED_OUTPATIENT_CLINIC_OR_DEPARTMENT_OTHER): Payer: Self-pay | Admitting: Obstetrics & Gynecology

## 2022-06-27 ENCOUNTER — Ambulatory Visit (INDEPENDENT_AMBULATORY_CARE_PROVIDER_SITE_OTHER): Payer: Medicaid Other | Admitting: Obstetrics & Gynecology

## 2022-06-27 VITALS — BP 118/72 | HR 92 | Wt 161.2 lb

## 2022-06-27 DIAGNOSIS — Z3480 Encounter for supervision of other normal pregnancy, unspecified trimester: Secondary | ICD-10-CM

## 2022-06-27 DIAGNOSIS — Z3483 Encounter for supervision of other normal pregnancy, third trimester: Secondary | ICD-10-CM

## 2022-06-27 DIAGNOSIS — O98811 Other maternal infectious and parasitic diseases complicating pregnancy, first trimester: Secondary | ICD-10-CM

## 2022-06-27 DIAGNOSIS — O283 Abnormal ultrasonic finding on antenatal screening of mother: Secondary | ICD-10-CM

## 2022-06-27 DIAGNOSIS — O98211 Gonorrhea complicating pregnancy, first trimester: Secondary | ICD-10-CM

## 2022-06-27 DIAGNOSIS — A749 Chlamydial infection, unspecified: Secondary | ICD-10-CM

## 2022-06-27 DIAGNOSIS — Z3A33 33 weeks gestation of pregnancy: Secondary | ICD-10-CM

## 2022-06-27 DIAGNOSIS — Z8619 Personal history of other infectious and parasitic diseases: Secondary | ICD-10-CM

## 2022-06-28 ENCOUNTER — Encounter (HOSPITAL_BASED_OUTPATIENT_CLINIC_OR_DEPARTMENT_OTHER): Payer: Medicaid Other | Admitting: Obstetrics & Gynecology

## 2022-06-28 NOTE — Progress Notes (Signed)
   PRENATAL VISIT NOTE  Subjective:  Andrea Vaughn is a 21 y.o. G2P0010 at [redacted]w[redacted]d being seen today for ongoing prenatal care.  She is currently monitored for the following issues for this low-risk pregnancy and has Supervision of other normal pregnancy, antepartum; ADHD (attention deficit hyperactivity disorder); Chlamydia infection affecting pregnancy; Gonorrhea affecting pregnancy; History of herpes genitalis; and Abnormal fetal ultrasound on their problem list.  Patient reports no complaints.  Contractions: Not present. Vag. Bleeding: None.  Movement: Present. Denies leaking of fluid.   The following portions of the patient's history were reviewed and updated as appropriate: allergies, current medications, past family history, past medical history, past social history, past surgical history and problem list.   Objective:   Vitals:   06/27/22 1120  BP: 118/72  Pulse: 92  Weight: 161 lb 3.2 oz (73.1 kg)    Fetal Status: Fetal Heart Rate (bpm): 146 Fundal Height: 31 cm Movement: Present     General:  Alert, oriented and cooperative. Patient is in no acute distress.  Skin: Skin is warm and dry. No rash noted.   Cardiovascular: Normal heart rate noted  Respiratory: Normal respiratory effort, no problems with respiration noted  Abdomen: Soft, gravid, appropriate for gestational age.  Pain/Pressure: Present     Pelvic: Cervical exam deferred        Extremities: Normal range of motion.  Edema: Trace  Mental Status: Normal mood and affect. Normal behavior. Normal judgment and thought content.   Assessment and Plan:  Pregnancy: G2P0010 at [redacted]w[redacted]d 1. Supervision of other normal pregnancy, antepartum - Recheck 2 weeks  2. [redacted] weeks gestation of pregnancy - on PNV and baby ASA  3. Abnormal fetal ultrasound - has enlarged cisterna magna.  Postnatal u/s recommended.  4. History of herpes genitalis - pt aware will need to start antiviral treatment at 36 weeks.  Discussed today.  5.  Chlamydia infection affecting pregnancy in first trimester - treated with negative TOC  6. Gonorrhea affecting pregnancy in first trimester - treated with negative TOC  Preterm labor symptoms and general obstetric precautions including but not limited to vaginal bleeding, contractions, leaking of fluid and fetal movement were reviewed in detail with the patient. Please refer to After Visit Summary for other counseling recommendations.   Return in about 2 weeks (around 07/11/2022).  Future Appointments  Date Time Provider Department Center  07/11/2022 11:00 AM Jerene Bears, MD [facility]  07/11/2022  3:30 PM [facility] NURSE [facility]  07/11/2022  3:45 PM [facility] US5 [facility]  07/22/2022 11:15 AM Jerene Bears, MD [facility]  07/29/2022 11:15 AM Marny Lowenstein, PA-C [facility]  08/05/2022 11:15 AM Jerene Bears, MD [facility]  08/12/2022 11:15 AM Jerene Bears, MD [facility]  08/19/2022 11:15 AM Jerene Bears, MD [facility]    Jerene Bears, MD

## 2022-06-29 ENCOUNTER — Other Ambulatory Visit (HOSPITAL_BASED_OUTPATIENT_CLINIC_OR_DEPARTMENT_OTHER): Payer: Self-pay | Admitting: Obstetrics & Gynecology

## 2022-07-04 IMAGING — US US OB COMP LESS 14 WK
1 series · 14 of 28 positions shown · non-contrast
Comparison: None.

CLINICAL DATA: Initial evaluation for acute abdominal pain, early
pregnancy.

EXAM:
OBSTETRIC <14 WK ULTRASOUND
TECHNIQUE: Transabdominal ultrasound was performed for evaluation of the
gestation as well as the maternal uterus and adnexal regions.

[Series 1: us ob comp less 14 wk · 14 of 32 slices shown]
[im 2/32]
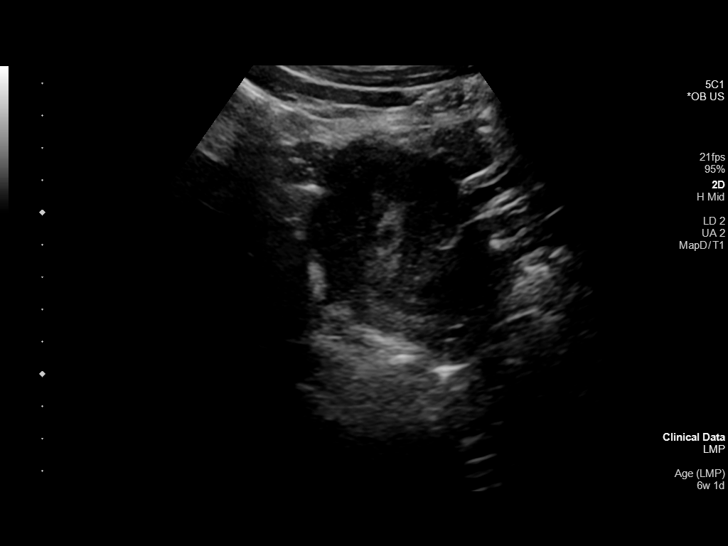
[im 4/32]
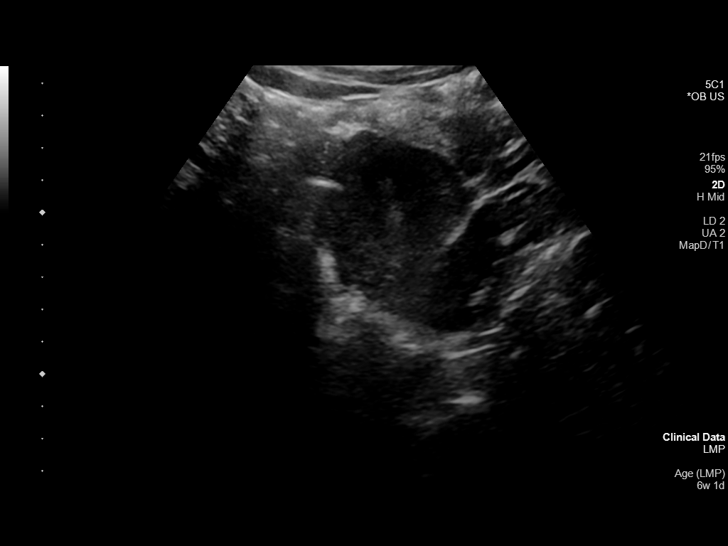
[im 6/32]
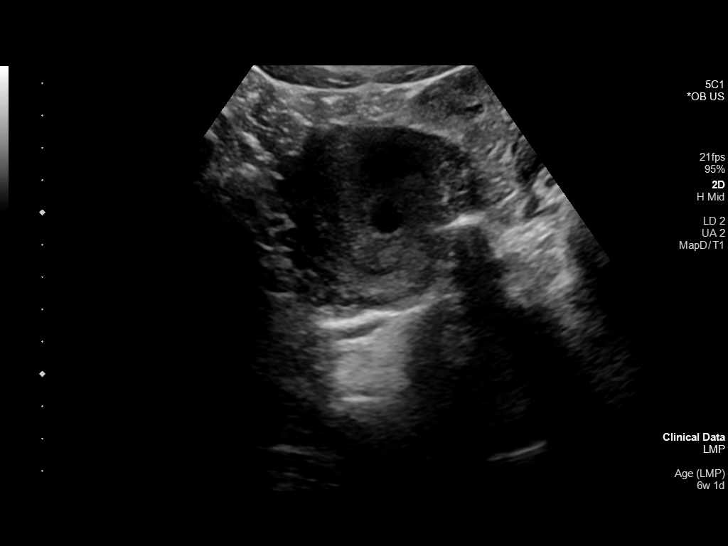
[im 9/32]
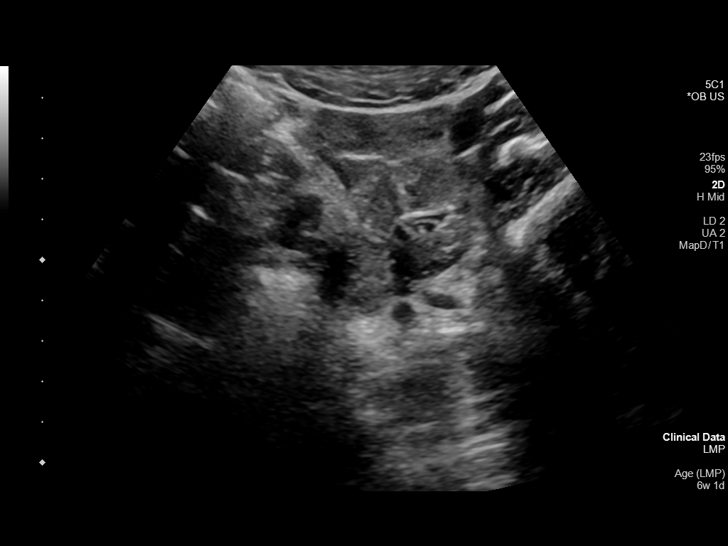
[im 11/32]
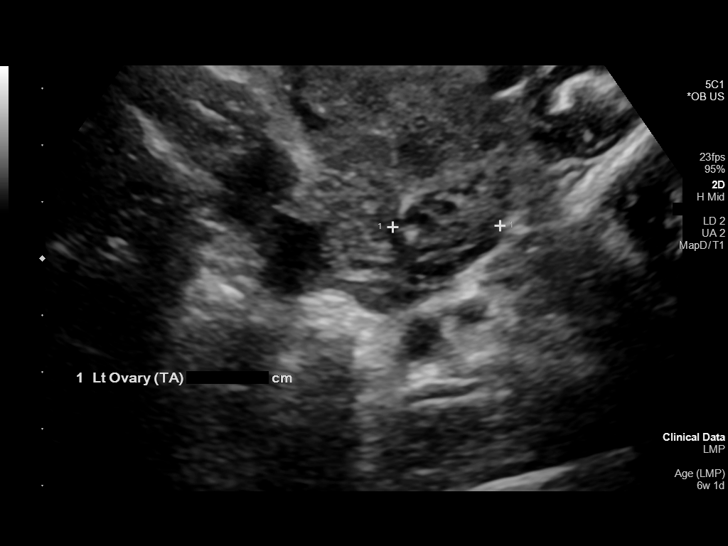
[im 13/32]
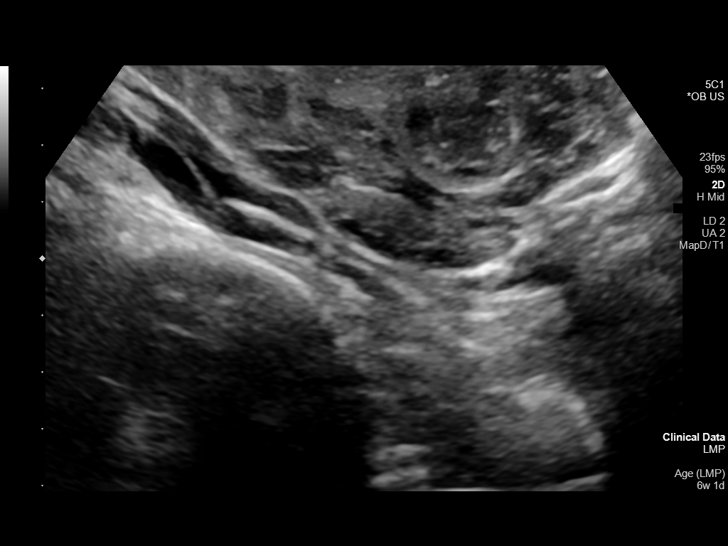
[im 15/32]
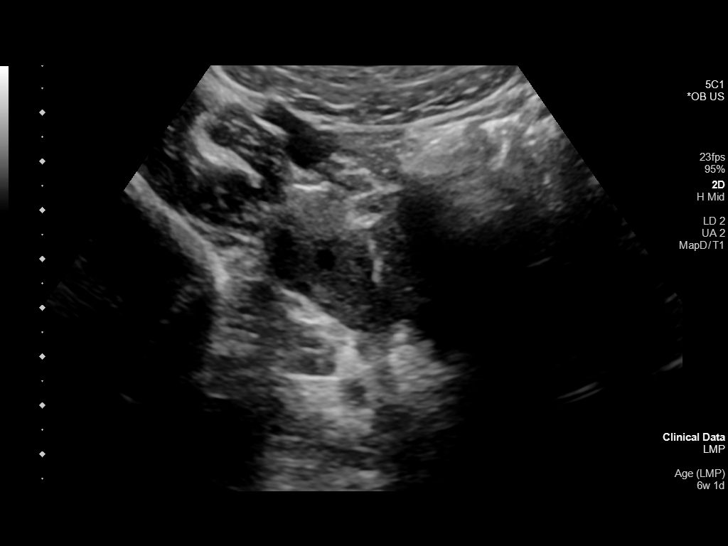
[im 18/32]
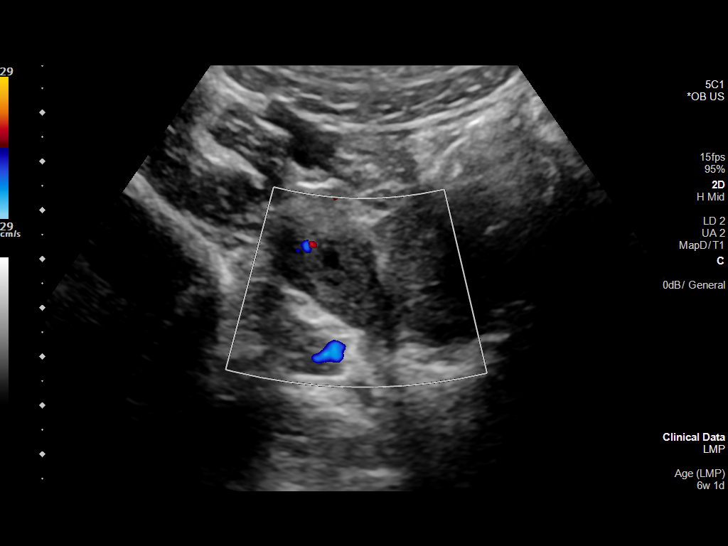
[im 20/32]
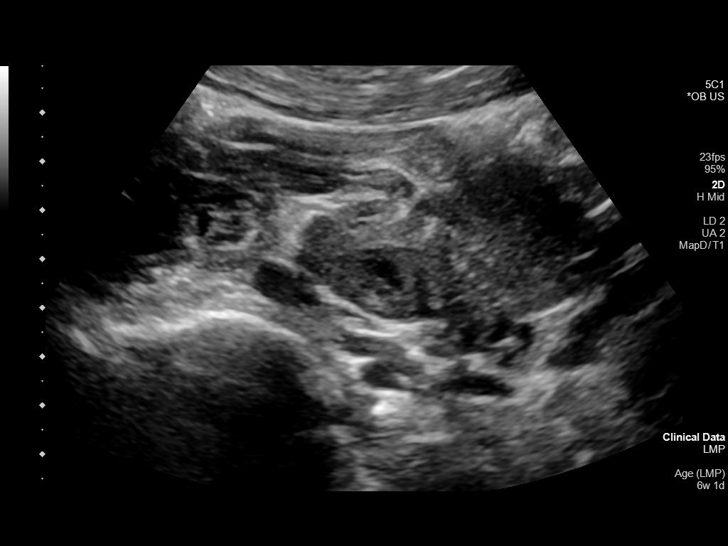
[im 22/32]
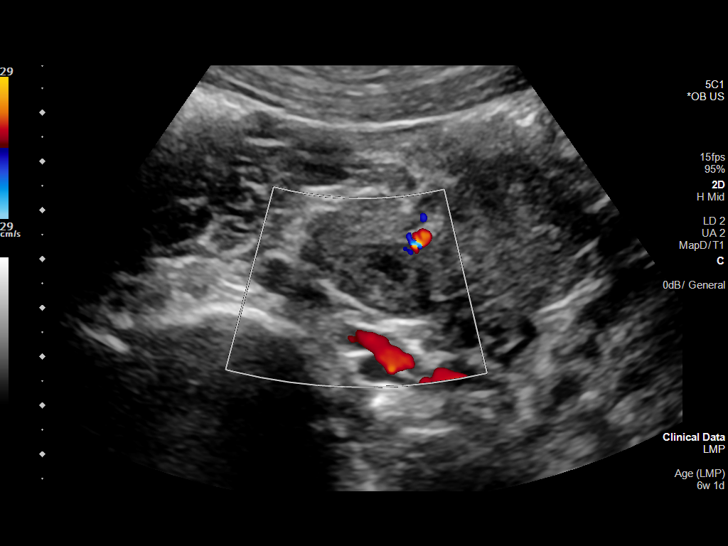
[im 25/32]
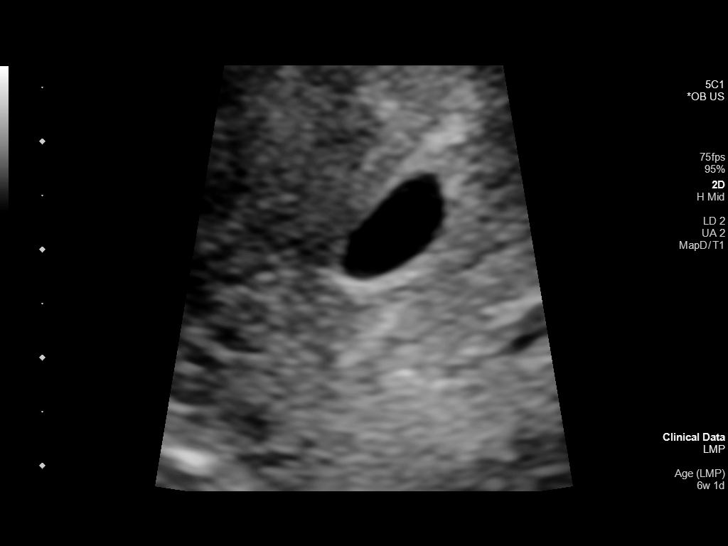
[im 27/32]
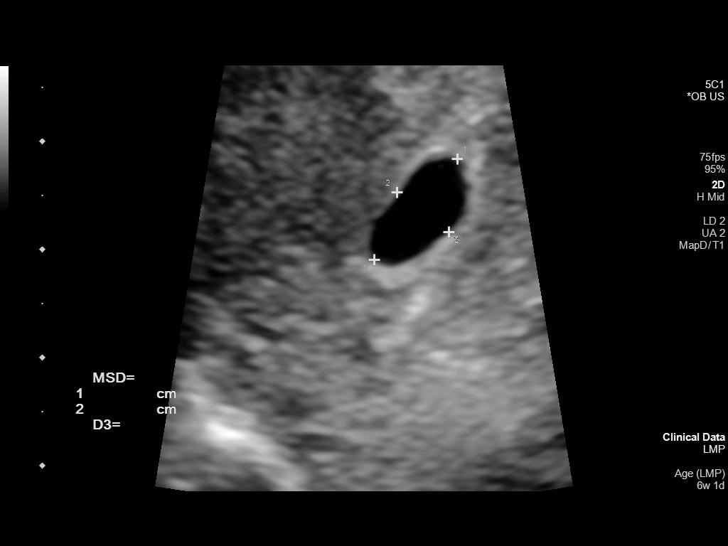
[im 29/32]
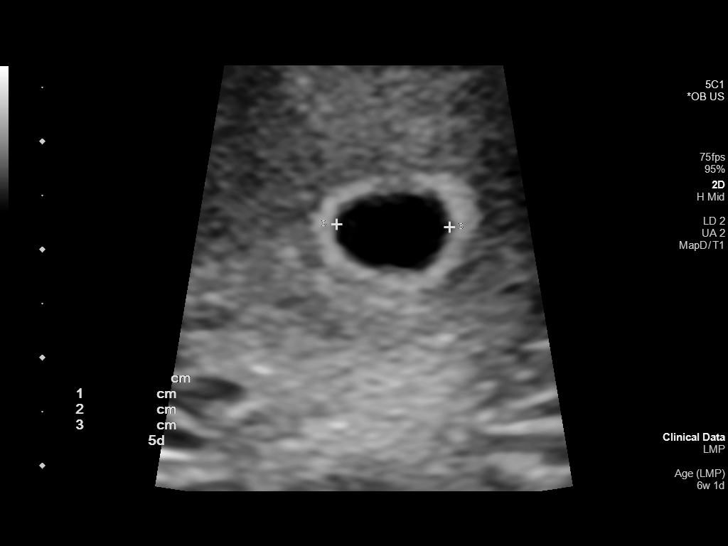
[im 32/32]
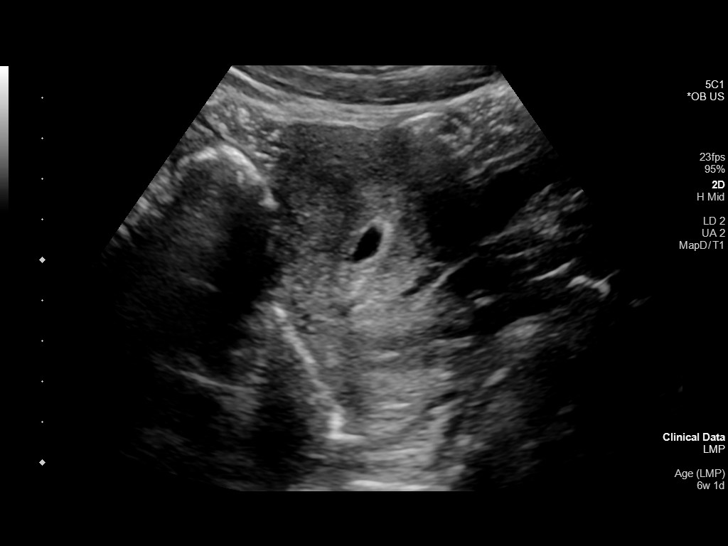

[14 of 28 positions shown; findings below may reference images not displayed]

FINDINGS: Intrauterine gestational sac: Single

Yolk sac:  Negative.

Embryo:  Negative.

Cardiac Activity: Negative.

Heart Rate: N/A

MSD:  9.6 mm   5 w   5 d

Subchorionic hemorrhage:  None visualized.

Maternal uterus/adnexae: Ovaries within normal limits bilaterally.
Small corpus luteal cyst noted on the right. No adnexal mass or free
fluid.
IMPRESSION: 1. Probable early intrauterine gestational sac, but no yolk sac,
fetal pole, or cardiac activity yet visualized. Recommend follow-up
quantitative B-HCG levels and follow-up US in 14 days to confirm and
assess viability. This recommendation follows SRU consensus
guidelines: Diagnostic Criteria for Nonviable Pregnancy Early in the
First Trimester. N Engl J Med 3711; [DATE].
2. No other acute maternal uterine or adnexal abnormality
identified.

## 2022-07-11 ENCOUNTER — Ambulatory Visit (INDEPENDENT_AMBULATORY_CARE_PROVIDER_SITE_OTHER): Payer: Medicaid Other | Admitting: Obstetrics & Gynecology

## 2022-07-11 ENCOUNTER — Encounter (HOSPITAL_BASED_OUTPATIENT_CLINIC_OR_DEPARTMENT_OTHER): Payer: Self-pay | Admitting: Obstetrics & Gynecology

## 2022-07-11 ENCOUNTER — Ambulatory Visit: Payer: Medicaid Other | Attending: Obstetrics and Gynecology

## 2022-07-11 ENCOUNTER — Encounter: Payer: Self-pay | Admitting: *Deleted

## 2022-07-11 ENCOUNTER — Ambulatory Visit: Payer: Medicaid Other | Admitting: *Deleted

## 2022-07-11 VITALS — BP 125/78 | HR 101 | Wt 161.8 lb

## 2022-07-11 VITALS — BP 115/68 | HR 89

## 2022-07-11 DIAGNOSIS — Z3A35 35 weeks gestation of pregnancy: Secondary | ICD-10-CM | POA: Diagnosis not present

## 2022-07-11 DIAGNOSIS — Q048 Other specified congenital malformations of brain: Secondary | ICD-10-CM | POA: Insufficient documentation

## 2022-07-11 DIAGNOSIS — Z3480 Encounter for supervision of other normal pregnancy, unspecified trimester: Secondary | ICD-10-CM | POA: Insufficient documentation

## 2022-07-11 DIAGNOSIS — Z3483 Encounter for supervision of other normal pregnancy, third trimester: Secondary | ICD-10-CM

## 2022-07-11 DIAGNOSIS — O283 Abnormal ultrasonic finding on antenatal screening of mother: Secondary | ICD-10-CM | POA: Diagnosis not present

## 2022-07-11 DIAGNOSIS — O98813 Other maternal infectious and parasitic diseases complicating pregnancy, third trimester: Secondary | ICD-10-CM

## 2022-07-11 DIAGNOSIS — O3500X0 Maternal care for (suspected) central nervous system malformation or damage in fetus, unspecified, not applicable or unspecified: Secondary | ICD-10-CM | POA: Diagnosis not present

## 2022-07-11 DIAGNOSIS — A749 Chlamydial infection, unspecified: Secondary | ICD-10-CM

## 2022-07-11 DIAGNOSIS — O98213 Gonorrhea complicating pregnancy, third trimester: Secondary | ICD-10-CM

## 2022-07-11 DIAGNOSIS — Z362 Encounter for other antenatal screening follow-up: Secondary | ICD-10-CM | POA: Insufficient documentation

## 2022-07-11 DIAGNOSIS — O98211 Gonorrhea complicating pregnancy, first trimester: Secondary | ICD-10-CM

## 2022-07-11 DIAGNOSIS — Z8619 Personal history of other infectious and parasitic diseases: Secondary | ICD-10-CM

## 2022-07-11 MED ORDER — VALACYCLOVIR HCL 500 MG PO TABS: 500.0000 mg | ORAL_TABLET | Freq: Two times a day (BID) | ORAL | 1 refills | Status: DC

## 2022-07-12 ENCOUNTER — Other Ambulatory Visit: Payer: Self-pay | Admitting: *Deleted

## 2022-07-12 DIAGNOSIS — O3509X9 Maternal care for (suspected) other central nervous system malformation or damage in fetus, other fetus: Secondary | ICD-10-CM

## 2022-07-12 DIAGNOSIS — Q048 Other specified congenital malformations of brain: Secondary | ICD-10-CM

## 2022-07-12 NOTE — Progress Notes (Signed)
   PRENATAL VISIT NOTE  Subjective:  Andrea Vaughn is a 21 y.o. G2P0010 at [redacted]w[redacted]d being seen today for ongoing prenatal care.  She is currently monitored for the following issues for this low-risk pregnancy and has Supervision of other normal pregnancy, antepartum; ADHD (attention deficit hyperactivity disorder); Chlamydia infection affecting pregnancy; Gonorrhea affecting pregnancy; History of herpes genitalis; and Abnormal fetal ultrasound on their problem list.  Patient reports no complaints.  Contractions: Not present. Vag. Bleeding: None.  Movement: Present. Denies leaking of fluid.   The following portions of the patient's history were reviewed and updated as appropriate: allergies, current medications, past family history, past medical history, past social history, past surgical history and problem list.   Objective:   Vitals:   07/11/22 1122  BP: 125/78  Pulse: (!) 101  Weight: 161 lb 12.8 oz (73.4 kg)    Fetal Status: Fetal Heart Rate (bpm): 145 Fundal Height: 36 cm Movement: Present  Presentation: Vertex  General:  Alert, oriented and cooperative. Patient is in no acute distress.  Skin: Skin is warm and dry. No rash noted.   Cardiovascular: Normal heart rate noted  Respiratory: Normal respiratory effort, no problems with respiration noted  Abdomen: Soft, gravid, appropriate for gestational age.  Pain/Pressure: Present     Pelvic: Cervical exam deferred        Extremities: Normal range of motion.  Edema: None  Mental Status: Normal mood and affect. Normal behavior. Normal judgment and thought content.   Assessment and Plan:  Pregnancy: G2P0010 at [redacted]w[redacted]d 1. [redacted] weeks gestation of pregnancy - on PNV and baby ASA  2. Supervision of other normal pregnancy, antepartum - recheck 1 week.  Will recheck GC/CHl and check GBS  3. History of herpes genitalis - knows to start this at 36 weeks.  No symptoms in pregnancy. - valACYclovir (VALTREX) 500 MG tablet; Take 1 tablet (500  mg total) by mouth 2 (two) times daily.  Dispense: 60 tablet; Refill: 1  4. Chlamydia infection affecting pregnancy in first trimester - treated with negative TOC  5. Gonorrhea affecting pregnancy in first trimester - treated with negative TOC  6. Abnormal fetal ultrasound - fetus with isolated enlarged cisterna magna.  Post delivery head ultrasound on baby has been recommended.  Preterm labor symptoms and general obstetric precautions including but not limited to vaginal bleeding, contractions, leaking of fluid and fetal movement were reviewed in detail with the patient. Please refer to After Visit Summary for other counseling recommendations.   Return in about 1 week (around 07/18/2022) for GC/Chl/GBS.  Future Appointments  Date Time Provider Department Center  07/22/2022 11:15 AM Jerene Bears, MD [facility]  07/25/2022 11:15 AM [facility] NURSE [facility]  07/25/2022 11:30 AM [facility] US2 [facility]  07/29/2022 11:15 AM Marny Lowenstein, PA-C [facility]  08/05/2022 11:15 AM Jerene Bears, MD [facility]  08/12/2022 11:15 AM Jerene Bears, MD [facility]  08/19/2022 11:15 AM Jerene Bears, MD [facility]    Jerene Bears, MD

## 2022-07-13 IMAGING — US US OB TRANSVAGINAL
2 series · 13 of 28 positions shown · non-contrast
Comparison: Ultrasound 10/03/2020

CLINICAL DATA: Bleeding, quantitative hCG 7699

EXAM:
OBSTETRIC <14 WK ULTRASOUND
TECHNIQUE: Transabdominal ultrasound was performed for evaluation of the
gestation as well as the maternal uterus and adnexal regions.

[Series 1: us ob transvaginal · 42 acquisitions, 6 frames shown (1 of 2)]
[im 4/42]
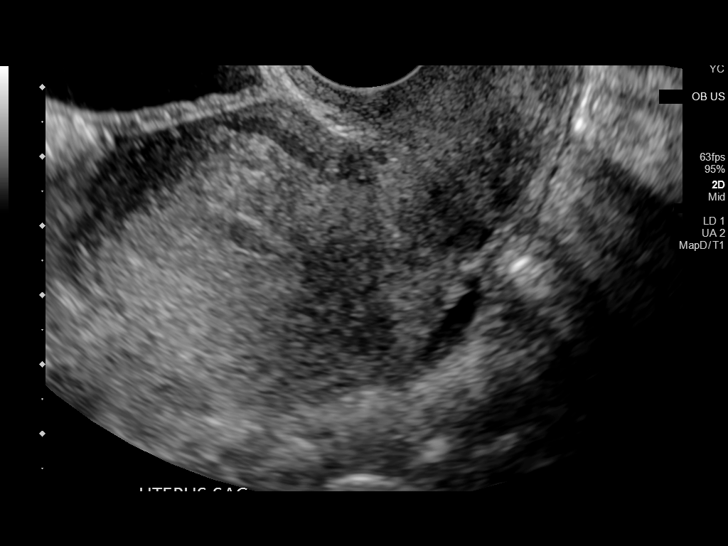
[im 10/42]
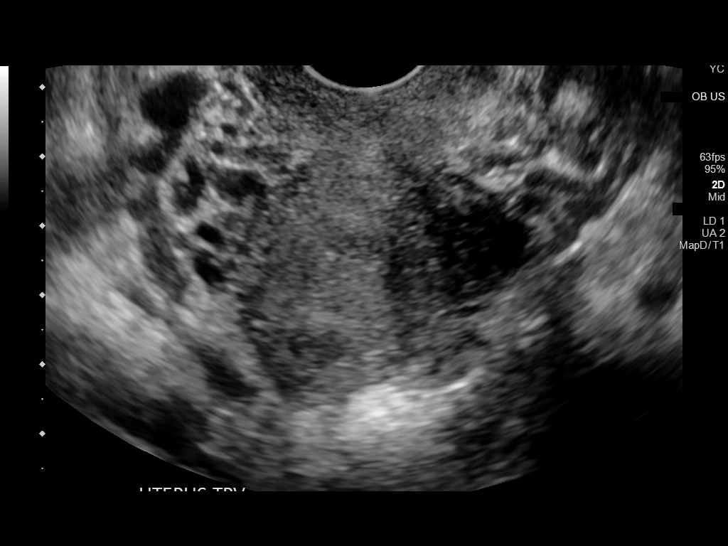
[im 16/42]
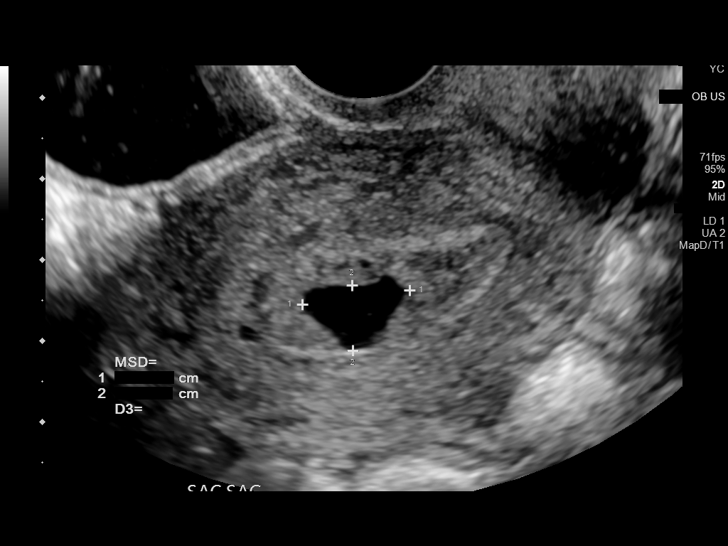
[im 23/42]
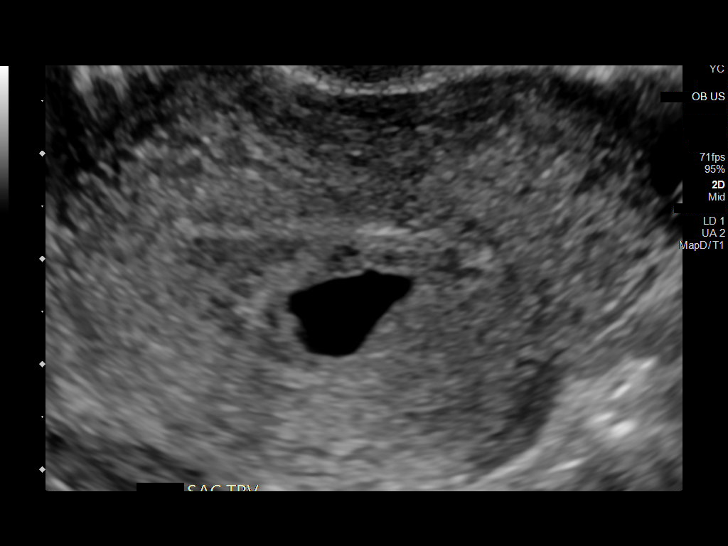
[im 29/42]
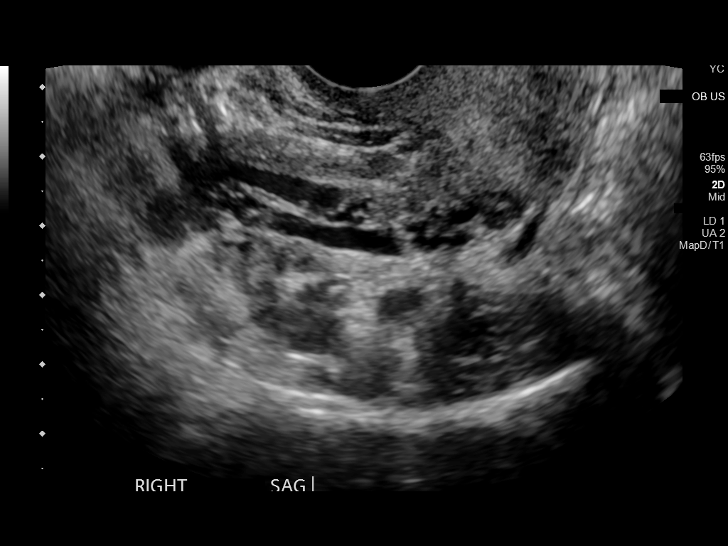
[im 35/42]
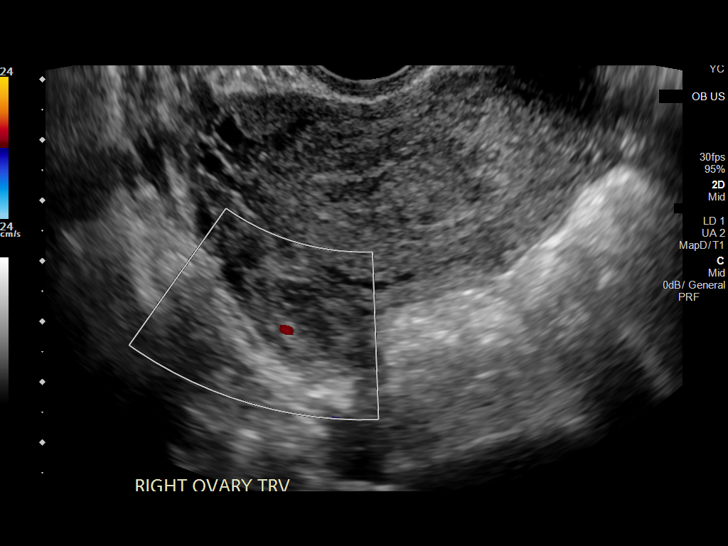

[Series 1: us ob transvaginal · 7 of 42 slices shown (2 of 2)]
[im 1/42]
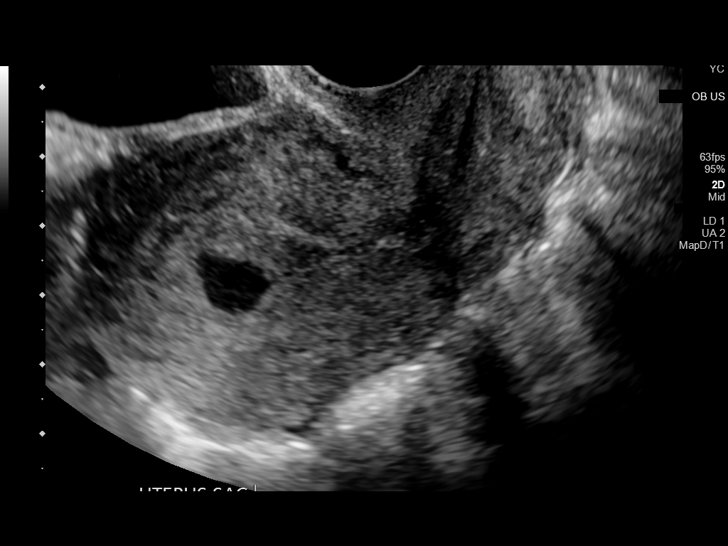
[im 7/42]
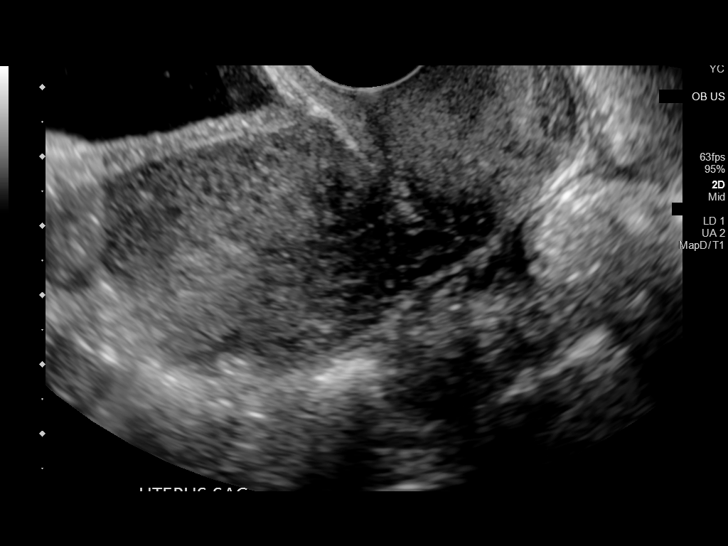
[im 13/42]
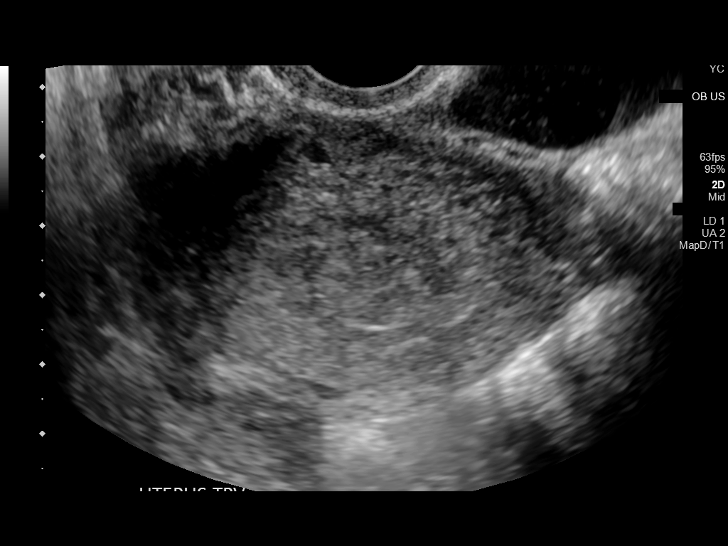
[im 19/42]
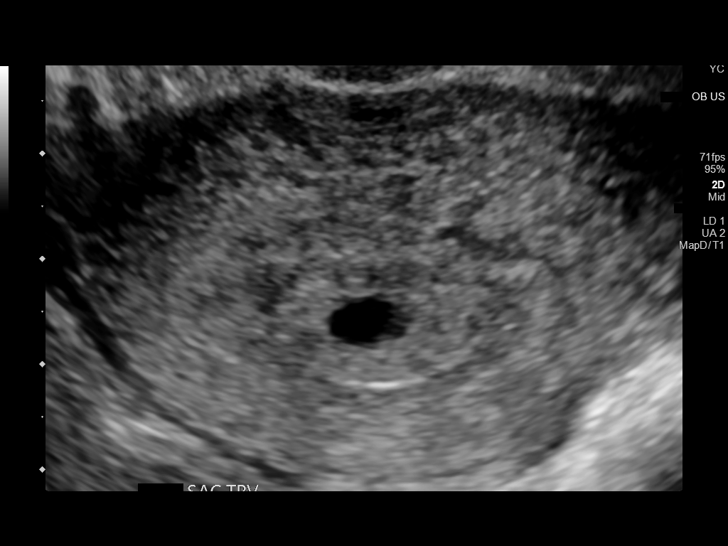
[im 26/42]
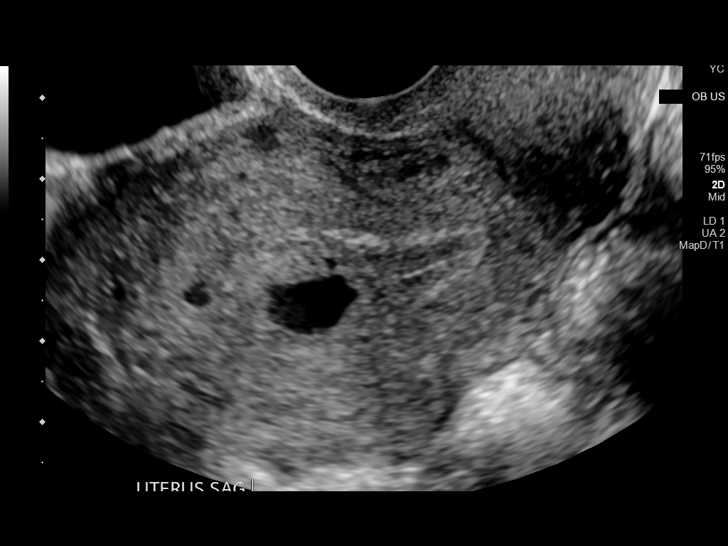
[im 32/42]
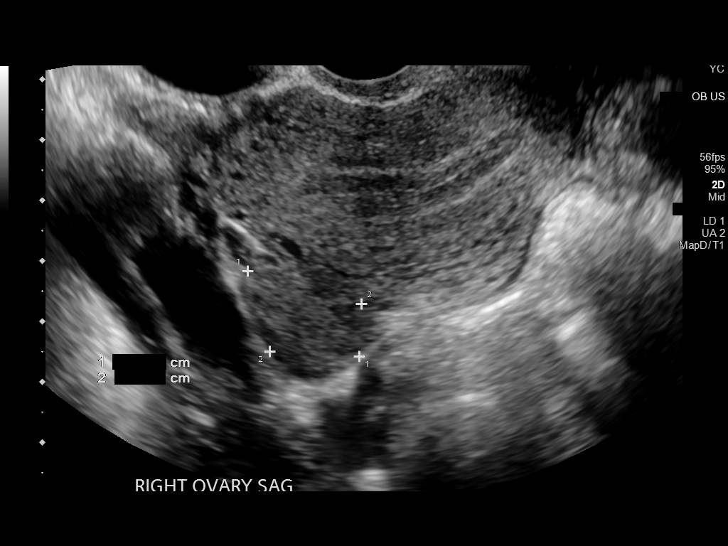
[im 38/42]
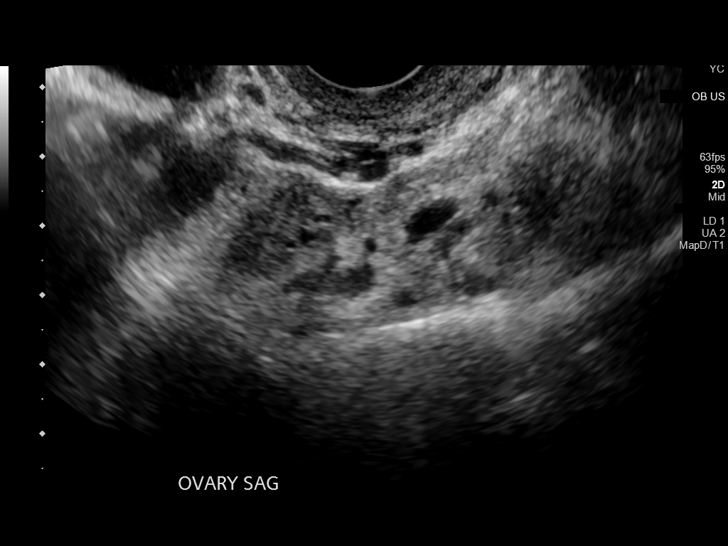

[13 of 28 positions shown; findings below may reference images not displayed]

FINDINGS: Intrauterine gestational sac: Single

Yolk sac:  Not Visualized.

Embryo:  Not Visualized.

Cardiac: Not Visualized.

MSD:  10.9 mm   5 w   6 d

Subchorionic hemorrhage: Small volume subchorionic hemorrhage of the
presumed implantation site (5/41).

Maternal uterus/adnexae: The anteverted maternal uterus is otherwise
unremarkable. No concerning adnexal lesions. Trace free fluid in the
pelvis is nonspecific and may be physiologic.
IMPRESSION: Gestational sac with absence of a discernible fetal pole or cardiac
activity 9 days after prior study demonstrating a probable
gestational sac.

Small volume subchorionic hemorrhage is also now seen adjacent the
implantation site. Findings are suspicious but not yet definitive
for failed pregnancy. Recommend follow-up US in 10-14 days for
definitive diagnosis. This recommendation follows SRU consensus
guidelines: Diagnostic Criteria for Nonviable Pregnancy Early in the
First Trimester. N Engl J Med 8864; [DATE].

## 2022-07-22 ENCOUNTER — Ambulatory Visit (INDEPENDENT_AMBULATORY_CARE_PROVIDER_SITE_OTHER): Payer: Medicaid Other | Admitting: Obstetrics & Gynecology

## 2022-07-22 ENCOUNTER — Other Ambulatory Visit (HOSPITAL_COMMUNITY)
Admission: RE | Admit: 2022-07-22 | Discharge: 2022-07-22 | Disposition: A | Discharge status: Home / Self Care | Payer: Medicaid Other | Source: Ambulatory Visit | Attending: Obstetrics & Gynecology | Admitting: Obstetrics & Gynecology

## 2022-07-22 VITALS — BP 108/72 | HR 99 | Wt 164.4 lb

## 2022-07-22 DIAGNOSIS — Z8619 Personal history of other infectious and parasitic diseases: Secondary | ICD-10-CM

## 2022-07-22 DIAGNOSIS — O283 Abnormal ultrasonic finding on antenatal screening of mother: Secondary | ICD-10-CM

## 2022-07-22 DIAGNOSIS — A749 Chlamydial infection, unspecified: Secondary | ICD-10-CM

## 2022-07-22 DIAGNOSIS — Z3A36 36 weeks gestation of pregnancy: Secondary | ICD-10-CM

## 2022-07-22 DIAGNOSIS — O98811 Other maternal infectious and parasitic diseases complicating pregnancy, first trimester: Secondary | ICD-10-CM

## 2022-07-22 DIAGNOSIS — O98211 Gonorrhea complicating pregnancy, first trimester: Secondary | ICD-10-CM

## 2022-07-22 DIAGNOSIS — Z3480 Encounter for supervision of other normal pregnancy, unspecified trimester: Secondary | ICD-10-CM

## 2022-07-22 DIAGNOSIS — Z3483 Encounter for supervision of other normal pregnancy, third trimester: Secondary | ICD-10-CM

## 2022-07-22 NOTE — Progress Notes (Unsigned)
   PRENATAL VISIT NOTE  Subjective:  Andrea Vaughn is a 21 y.o. G2P0010 at [redacted]w[redacted]d being seen today for ongoing prenatal care.  She is currently monitored for the following issues for this {Blank single:19197::"high-risk","low-risk"} pregnancy and has Supervision of other normal pregnancy, antepartum; ADHD (attention deficit hyperactivity disorder); Chlamydia infection affecting pregnancy; Gonorrhea affecting pregnancy; History of herpes genitalis; and Abnormal fetal ultrasound on their problem list.  Patient reports {sx:14538}.  Contractions: Not present. Vag. Bleeding: None.  Movement: Present. Denies leaking of fluid.   The following portions of the patient's history were reviewed and updated as appropriate: allergies, current medications, past family history, past medical history, past social history, past surgical history and problem list.   Objective:   Vitals:   07/22/22 1121  BP: 108/72  Pulse: 99  Weight: 164 lb 6.4 oz (74.6 kg)    Fetal Status: Fetal Heart Rate (bpm): 160 Fundal Height: 36 cm Movement: Present  Presentation: Vertex  General:  Alert, oriented and cooperative. Patient is in no acute distress.  Skin: Skin is warm and dry. No rash noted.   Cardiovascular: Normal heart rate noted  Respiratory: Normal respiratory effort, no problems with respiration noted  Abdomen: Soft, gravid, appropriate for gestational age.  Pain/Pressure: Present     Pelvic: {Blank single:19197::"Cervical exam performed in the presence of a chaperone","Cervical exam deferred"} Dilation: 1 Effacement (%): 50 Station: -3  Extremities: Normal range of motion.  Edema: None  Mental Status: Normal mood and affect. Normal behavior. Normal judgment and thought content.   Assessment and Plan:  Pregnancy: G2P0010 at [redacted]w[redacted]d 1. Prenatal care, subsequent pregnancy, third trimester *** - Strep Gp B NAA+Rflx - Cervicovaginal ancillary only( [place])  2. [redacted] weeks gestation of pregnancy ***  3.  Supervision of other normal pregnancy, antepartum ***  {Blank single:19197::"Term","Preterm"} labor symptoms and general obstetric precautions including but not limited to vaginal bleeding, contractions, leaking of fluid and fetal movement were reviewed in detail with the patient. Please refer to After Visit Summary for other counseling recommendations.   No follow-ups on file.  Future Appointments  Date Time Provider Department Center  07/25/2022 11:15 AM [facility] NURSE [facility]  07/25/2022 11:30 AM [facility] US2 [facility]  07/29/2022 11:15 AM Marny Lowenstein, PA-C [facility]  08/05/2022 11:15 AM Jerene Bears, MD [facility]  08/12/2022 11:15 AM Jerene Bears, MD [facility]  08/19/2022 11:15 AM Jerene Bears, MD [facility]    Harrie Jeans, RN

## 2022-07-24 LAB — OB RESULTS CONSOLE GBS: GBS: NEGATIVE

## 2022-07-24 LAB — STREP GP B NAA+RFLX: Strep Gp B NAA+Rflx: NEGATIVE

## 2022-07-25 ENCOUNTER — Ambulatory Visit: Payer: Medicaid Other | Admitting: *Deleted

## 2022-07-25 ENCOUNTER — Ambulatory Visit: Payer: Medicaid Other | Attending: Obstetrics and Gynecology

## 2022-07-25 VITALS — BP 124/68 | HR 88

## 2022-07-25 DIAGNOSIS — O3509X9 Maternal care for (suspected) other central nervous system malformation or damage in fetus, other fetus: Secondary | ICD-10-CM | POA: Diagnosis not present

## 2022-07-25 DIAGNOSIS — Z3480 Encounter for supervision of other normal pregnancy, unspecified trimester: Secondary | ICD-10-CM

## 2022-07-25 DIAGNOSIS — O289 Unspecified abnormal findings on antenatal screening of mother: Secondary | ICD-10-CM | POA: Insufficient documentation

## 2022-07-25 DIAGNOSIS — Z3A37 37 weeks gestation of pregnancy: Secondary | ICD-10-CM | POA: Insufficient documentation

## 2022-07-25 DIAGNOSIS — Q048 Other specified congenital malformations of brain: Secondary | ICD-10-CM

## 2022-07-25 LAB — CERVICOVAGINAL ANCILLARY ONLY
Chlamydia: NEGATIVE
Comment: NEGATIVE
Comment: NORMAL
Neisseria Gonorrhea: NEGATIVE

## 2022-07-29 ENCOUNTER — Ambulatory Visit (INDEPENDENT_AMBULATORY_CARE_PROVIDER_SITE_OTHER): Payer: Medicaid Other | Admitting: Medical

## 2022-07-29 ENCOUNTER — Encounter (HOSPITAL_BASED_OUTPATIENT_CLINIC_OR_DEPARTMENT_OTHER): Payer: Self-pay | Admitting: Medical

## 2022-07-29 VITALS — BP 126/78 | HR 94 | Wt 165.4 lb

## 2022-07-29 DIAGNOSIS — O283 Abnormal ultrasonic finding on antenatal screening of mother: Secondary | ICD-10-CM

## 2022-07-29 DIAGNOSIS — Z3480 Encounter for supervision of other normal pregnancy, unspecified trimester: Secondary | ICD-10-CM

## 2022-07-29 DIAGNOSIS — Z3A37 37 weeks gestation of pregnancy: Secondary | ICD-10-CM

## 2022-07-29 DIAGNOSIS — Z8619 Personal history of other infectious and parasitic diseases: Secondary | ICD-10-CM

## 2022-07-29 NOTE — Progress Notes (Signed)
   PRENATAL VISIT NOTE  Subjective:  Andrea Vaughn is a 21 y.o. G2P0010 at [redacted]w[redacted]d being seen today for ongoing prenatal care.  She is currently monitored for the following issues for this low-risk pregnancy and has Supervision of other normal pregnancy, antepartum; ADHD (attention deficit hyperactivity disorder); Chlamydia infection affecting pregnancy; Gonorrhea affecting pregnancy; History of herpes genitalis; and Abnormal fetal ultrasound on their problem list.  Patient reports occasional contractions.  Contractions: Irregular. Vag. Bleeding: None.  Movement: Present. Denies leaking of fluid.   The following portions of the patient's history were reviewed and updated as appropriate: allergies, current medications, past family history, past medical history, past social history, past surgical history and problem list.   Objective:   Vitals:   07/29/22 1144  BP: 126/78  Pulse: 94  Weight: 165 lb 6.4 oz (75 kg)    Fetal Status: Fetal Heart Rate (bpm): 151 Fundal Height: 37 cm Movement: Present  Presentation: Vertex  General:  Alert, oriented and cooperative. Patient is in no acute distress.  Skin: Skin is warm and dry. No rash noted.   Cardiovascular: Normal heart rate noted  Respiratory: Normal respiratory effort, no problems with respiration noted  Abdomen: Soft, gravid, appropriate for gestational age.  Pain/Pressure: Present     Pelvic: Cervical exam performed in the presence of a chaperone Dilation: 1 Effacement (%): 50 Station: -3  Extremities: Normal range of motion.  Edema: None  Mental Status: Normal mood and affect. Normal behavior. Normal judgment and thought content.   Assessment and Plan:  Pregnancy: G2P0010 at [redacted]w[redacted]d 1. Supervision of other normal pregnancy, antepartum - Planning POPs  2. Abnormal fetal ultrasound - Enlarged Cisterna Magna - MFM states delivery at 39 weeks reasonable  - Discussed risks and benefits of IOL at 39 weeks in primigravida vs later  delivery with enlarged cisterna magna, patient will consider options prior to next visit   3. History of herpes genitalis - On Valtrex  4. [redacted] weeks gestation of pregnancy  Term labor symptoms and general obstetric precautions including but not limited to vaginal bleeding, contractions, leaking of fluid and fetal movement were reviewed in detail with the patient. Please refer to After Visit Summary for other counseling recommendations.   Return in about 1 week (around 08/05/2022) for LOB, any provider, as scheduled.  Future Appointments  Date Time Provider Department Center  08/05/2022 11:15 AM Jerene Bears, MD [facility]  08/12/2022 10:15 AM Marny Lowenstein, PA-C [facility]  08/19/2022 11:15 AM Jerene Bears, MD [facility]    Vonzella Nipple, PA-C

## 2022-08-02 ENCOUNTER — Encounter (HOSPITAL_COMMUNITY): Payer: Self-pay | Admitting: Obstetrics and Gynecology

## 2022-08-02 ENCOUNTER — Inpatient Hospital Stay (HOSPITAL_COMMUNITY)
Admission: AD | Admit: 2022-08-02 | Discharge: 2022-08-02 | Disposition: A | Discharge status: Home / Self Care | Payer: Medicaid Other | Attending: Obstetrics and Gynecology | Admitting: Obstetrics and Gynecology

## 2022-08-02 DIAGNOSIS — R55 Syncope and collapse: Secondary | ICD-10-CM | POA: Diagnosis not present

## 2022-08-02 DIAGNOSIS — Z3A38 38 weeks gestation of pregnancy: Secondary | ICD-10-CM

## 2022-08-02 DIAGNOSIS — R42 Dizziness and giddiness: Secondary | ICD-10-CM | POA: Diagnosis not present

## 2022-08-02 DIAGNOSIS — O26893 Other specified pregnancy related conditions, third trimester: Secondary | ICD-10-CM | POA: Insufficient documentation

## 2022-08-02 LAB — URINALYSIS, ROUTINE W REFLEX MICROSCOPIC
Bilirubin Urine: NEGATIVE
Glucose, UA: NEGATIVE mg/dL
Hgb urine dipstick: NEGATIVE
Ketones, ur: NEGATIVE mg/dL
Nitrite: NEGATIVE
Protein, ur: NEGATIVE mg/dL
Specific Gravity, Urine: 1.006 (ref 1.005–1.030)
pH: 7 (ref 5.0–8.0)

## 2022-08-02 LAB — CBC
HCT: 37.2 % (ref 36.0–46.0)
Hemoglobin: 12.8 g/dL (ref 12.0–15.0)
MCH: 30.3 pg (ref 26.0–34.0)
MCHC: 34.4 g/dL (ref 30.0–36.0)
MCV: 88.2 fL (ref 80.0–100.0)
Platelets: 198 10*3/uL (ref 150–400)
RBC: 4.22 MIL/uL (ref 3.87–5.11)
RDW: 13.2 % (ref 11.5–15.5)
WBC: 5.6 10*3/uL (ref 4.0–10.5)
nRBC: 0 % (ref 0.0–0.2)

## 2022-08-02 NOTE — MAU Note (Signed)
..  [name] is a 21 y.o. at [redacted]w[redacted]d here in MAU reporting: Near syncopal episode while she was sitting in class. States she was sitting down and felt like she was about to pass out, put her head down and laid back and put her legs up. When she felt better she walked around and drank water. She still continued to feel dizzy so she left and came here.   Now she feels dizzy when she walks around. Has a mild headache and is nauseous.   +FM. Denies leaking of fluid, vaginal bleeding, or regular contractions.   Onset of complaint: 7pm sudden Pain score: 4/10 Vitals:   08/02/22 2050  BP: 124/77  Pulse: 100  Resp: 19  Temp: 98.1 F (36.7 C)  SpO2: 100%     YQM:VHQIONG in room  Lab orders placed from triage: none

## 2022-08-02 NOTE — Discharge Instructions (Signed)

## 2022-08-02 NOTE — MAU Provider Note (Signed)
History     CSN: [id number]  Arrival date and time: 08/02/22 2025   Event Date/Time   First Provider Initiated Contact with Patient 08/02/22 2101      Chief Complaint  Patient presents with   Near Syncope   HPI  Andrea Vaughn is a 21 y.o. G2P0010 at [redacted]w[redacted]d who presents for evaluation of dizziness. Patient reports she was in class around 1900 and was feeling really dizzy. She states she drank some water and walked around and it helped the dizziness. She is no longer feeling dizzy now. She reports she last ate [employer] at 1300.  She denies any pain.  She denies any vaginal bleeding, discharge, and leaking of fluid. Denies any constipation, diarrhea or any urinary complaints. Reports normal fetal movement.   OB History     Gravida  2   Para      Term      Preterm      AB  1   Living         SAB  1   IAB      Ectopic      Multiple      Live Births              Past Medical History:  Diagnosis Date   ADHD (attention deficit hyperactivity disorder)    Genital herpes in women    Seasonal allergies     Past Surgical History:  Procedure Laterality Date   NO PAST SURGERIES      Family History  Problem Relation Age of Onset   Anemia Mother    Healthy Father    Cancer Maternal Grandmother     Social History   Tobacco Use   Smoking status: Never    Passive exposure: Never   Smokeless tobacco: Never  Vaping Use   Vaping Use: Never used  Substance Use Topics   Alcohol use: Not Currently    Comment: socially   Drug use: Not Currently    Types: Marijuana    Comment: last used 1 month ago    Allergies:  Allergies  Allergen Reactions   Penicillins Rash    " antibiotic" mom unsure of med.  Fine rash with Amoxicillin     No medications prior to admission.    Review of Systems  Constitutional: Negative.  Negative for fatigue and fever.  HENT: Negative.    Respiratory: Negative.  Negative for shortness of breath.   Cardiovascular:  Negative.  Negative for chest pain.  Gastrointestinal: Negative.  Negative for abdominal pain, constipation, diarrhea, nausea and vomiting.  Genitourinary: Negative.  Negative for dysuria.  Neurological:  Positive for dizziness. Negative for headaches.   Physical Exam   Blood pressure 120/71, pulse 100, temperature 98.1 F (36.7 C), temperature source Oral, resp. rate 19, height 5\' 4"  (1.626 m), weight 72.6 kg, last menstrual period 11/08/2021, SpO2 100 %, unknown if currently breastfeeding.  Patient Vitals for the past 24 hrs:  BP Temp Temp src Pulse Resp SpO2 Height Weight  08/02/22 2204 120/71 -- -- -- -- -- -- --  08/02/22 2050 124/77 98.1 F (36.7 C) Oral 100 19 100 % 5\' 4"  (1.626 m) 72.6 kg    Physical Exam Vitals and nursing note reviewed.  Constitutional:      General: She is not in acute distress.    Appearance: She is well-developed.  HENT:     Head: Normocephalic.  Eyes:     Pupils: Pupils are equal, round, and reactive  to light.  Cardiovascular:     Rate and Rhythm: Normal rate and regular rhythm.     Heart sounds: Normal heart sounds.  Pulmonary:     Effort: Pulmonary effort is normal. No respiratory distress.     Breath sounds: Normal breath sounds.  Abdominal:     General: Bowel sounds are normal. There is no distension.     Palpations: Abdomen is soft.     Tenderness: There is no abdominal tenderness.  Skin:    General: Skin is warm and dry.  Neurological:     Mental Status: She is alert and oriented to person, place, and time.  Psychiatric:        Mood and Affect: Mood normal.        Behavior: Behavior normal.        Thought Content: Thought content normal.        Judgment: Judgment normal.     Fetal Tracing:  Baseline: 130 Variability: moderate Accels: 15x15 Decels: none  Toco: none  MAU Course  Procedures  Results for orders placed or performed during the hospital encounter of 08/02/22 (from the past 24 hour(s))  CBC     Status: None    Collection Time: 08/02/22  9:20 PM  Result Value Ref Range   WBC 5.6 4.0 - 10.5 K/uL   RBC 4.22 3.87 - 5.11 MIL/uL   Hemoglobin 12.8 12.0 - 15.0 g/dL   HCT 96.2 95.2 - 84.1 %   MCV 88.2 80.0 - 100.0 fL   MCH 30.3 26.0 - 34.0 pg   MCHC 34.4 30.0 - 36.0 g/dL   RDW 32.4 40.1 - 02.7 %   Platelets 198 150 - 400 K/uL   nRBC 0.0 0.0 - 0.2 %  Urinalysis, Routine w reflex microscopic     Status: Abnormal   Collection Time: 08/02/22  9:24 PM  Result Value Ref Range   Color, Urine YELLOW YELLOW   APPearance HAZY (A) CLEAR   Specific Gravity, Urine 1.006 1.005 - 1.030   pH 7.0 5.0 - 8.0   Glucose, UA NEGATIVE NEGATIVE mg/dL   Hgb urine dipstick NEGATIVE NEGATIVE   Bilirubin Urine NEGATIVE NEGATIVE   Ketones, ur NEGATIVE NEGATIVE mg/dL   Protein, ur NEGATIVE NEGATIVE mg/dL   Nitrite NEGATIVE NEGATIVE   Leukocytes,Ua LARGE (A) NEGATIVE   RBC / HPF 6-10 0 - 5 RBC/hpf   WBC, UA 11-20 0 - 5 WBC/hpf   Bacteria, UA FEW (A) NONE SEEN   Squamous Epithelial / LPF 6-10 0 - 5   Mucus PRESENT     MDM Labs ordered and reviewed.   UA CBC Food provided in MAU. Discussed importance of eating frequent high protein meals   Assessment and Plan   1. Near syncope   2. [redacted] weeks gestation of pregnancy     -Discharge home in stable condition -Third trimester precautions discussed -Patient advised to follow-up with OB as scheduled for prenatal care -Patient may return to MAU as needed or if her condition were to change or worsen  Rolm Bookbinder, CNM 08/02/2022, 9:01 PM

## 2022-08-04 LAB — CULTURE, OB URINE: Culture: <10000 — AB

## 2022-08-05 ENCOUNTER — Other Ambulatory Visit: Payer: Self-pay | Admitting: Advanced Practice Midwife

## 2022-08-05 ENCOUNTER — Ambulatory Visit (INDEPENDENT_AMBULATORY_CARE_PROVIDER_SITE_OTHER): Payer: Medicaid Other | Admitting: Obstetrics & Gynecology

## 2022-08-05 VITALS — BP 116/75 | HR 97 | Wt 162.4 lb

## 2022-08-05 DIAGNOSIS — O98811 Other maternal infectious and parasitic diseases complicating pregnancy, first trimester: Secondary | ICD-10-CM

## 2022-08-05 DIAGNOSIS — Z3A38 38 weeks gestation of pregnancy: Secondary | ICD-10-CM

## 2022-08-05 DIAGNOSIS — A749 Chlamydial infection, unspecified: Secondary | ICD-10-CM

## 2022-08-05 DIAGNOSIS — O283 Abnormal ultrasonic finding on antenatal screening of mother: Secondary | ICD-10-CM

## 2022-08-05 DIAGNOSIS — Z8619 Personal history of other infectious and parasitic diseases: Secondary | ICD-10-CM

## 2022-08-05 DIAGNOSIS — Q048 Other specified congenital malformations of brain: Secondary | ICD-10-CM

## 2022-08-05 DIAGNOSIS — O98211 Gonorrhea complicating pregnancy, first trimester: Secondary | ICD-10-CM

## 2022-08-05 DIAGNOSIS — Z3480 Encounter for supervision of other normal pregnancy, unspecified trimester: Secondary | ICD-10-CM

## 2022-08-06 ENCOUNTER — Other Ambulatory Visit (HOSPITAL_BASED_OUTPATIENT_CLINIC_OR_DEPARTMENT_OTHER): Payer: Self-pay | Admitting: Obstetrics & Gynecology

## 2022-08-06 ENCOUNTER — Encounter (HOSPITAL_BASED_OUTPATIENT_CLINIC_OR_DEPARTMENT_OTHER): Payer: Self-pay | Admitting: Obstetrics & Gynecology

## 2022-08-06 DIAGNOSIS — Z8619 Personal history of other infectious and parasitic diseases: Secondary | ICD-10-CM

## 2022-08-06 DIAGNOSIS — Q753 Macrocephaly: Secondary | ICD-10-CM | POA: Insufficient documentation

## 2022-08-06 NOTE — Progress Notes (Signed)
   PRENATAL VISIT NOTE  Subjective:  Andrea Vaughn is a 21 y.o. G2P0010 at [redacted]w[redacted]d being seen today for ongoing prenatal care.  She is currently monitored for the following issues for this low-risk pregnancy and has Supervision of other normal pregnancy, antepartum; ADHD (attention deficit hyperactivity disorder); Chlamydia infection affecting pregnancy; Gonorrhea affecting pregnancy; History of herpes genitalis; Abnormal fetal ultrasound; and Macrocephaly on their problem list.  Patient reports no complaints.  Contractions: Irregular. Vag. Bleeding: None.  Movement: Present. Denies leaking of fluid.   Discussed with pt delivery around 39 weeks.  Risks of longer induction with increased risk of cesarean section vs waiting for spontaneous labor and risks of baby's head being larger and unable to delivery vaginally at that point.  Last MFM ultrasound was 9/11 (37 weeks) with BPD 95.27mm at that time (97% and about size of average 39 week fetus).  Now she is almost two weeks later.  Pt's mother present as well for discussion.  Foley bulb placement with monitoring offered prior to induction as well.  Risks for rupture of membranes and need for delivery, fetal stress and need for delivery, vaginal bleeding and need for delivery all discussed.  Questions answered.  The following portions of the patient's history were reviewed and updated as appropriate: allergies, current medications, past family history, past medical history, past social history, past surgical history and problem list.   Objective:   Vitals:   08/05/22 1106  BP: 116/75  Pulse: 97  Weight: 162 lb 6.4 oz (73.7 kg)    Fetal Status: Fetal Heart Rate (bpm): 138   Movement: Present     General:  Alert, oriented and cooperative. Patient is in no acute distress.  Skin: Skin is warm and dry. No rash noted.   Cardiovascular: Normal heart rate noted  Respiratory: Normal respiratory effort, no problems with respiration noted  Abdomen:  Soft, gravid, appropriate for gestational age.  Pain/Pressure: Present     Pelvic: Cervical exam performed in the presence of a chaperone        Extremities: Normal range of motion.  Edema: None  Mental Status: Normal mood and affect. Normal behavior. Normal judgment and thought content.   Assessment and Plan:  Pregnancy: G2P0010 at [redacted]w[redacted]d 1. Supervision of other normal pregnancy, antepartum  2. [redacted] weeks gestation of pregnancy - on PNV and baby ASA  3.  Abnormal fetal ultrasound - enlarged cisterna magna with BPD measuring 2+ weeks ahead at last ultrasound on 07/25/2022 with MFM.  Delivery at 39 weeks recommended.  - pt will return Monday for foley bulb placement.  Induction will be scheduled at 39 weeks.  5. History of herpes genitalis - on valtrex  6. Gonorrhea affecting pregnancy in first trimester - treated in first trimester with negative TOC and negative 3rd trimester testing  7. Chlamydia infection affecting pregnancy in first trimester - treated in first trimester with negative TOC and negative 3rd trimester testing  Term labor symptoms and general obstetric precautions including but not limited to vaginal bleeding, contractions, leaking of fluid and fetal movement were reviewed in detail with the patient. Please refer to After Visit Summary for other counseling recommendations.   Return in 3 days (on 08/08/2022) for foley bulb placement.  Future Appointments  Date Time Provider [place]  08/08/2022  9:45 AM Megan Salon, MD [facility]  08/09/2022 12:00 AM [facility] ROOM [facility] None    Megan Salon, MD

## 2022-08-08 ENCOUNTER — Telehealth (HOSPITAL_COMMUNITY): Payer: Self-pay | Admitting: *Deleted

## 2022-08-08 ENCOUNTER — Other Ambulatory Visit: Payer: Self-pay

## 2022-08-08 ENCOUNTER — Ambulatory Visit (INDEPENDENT_AMBULATORY_CARE_PROVIDER_SITE_OTHER): Payer: Medicaid Other | Admitting: Obstetrics & Gynecology

## 2022-08-08 ENCOUNTER — Inpatient Hospital Stay (HOSPITAL_COMMUNITY)
Admission: AD | Admit: 2022-08-08 | Discharge: 2022-08-13 | DRG: 787 | Disposition: A | Discharge status: Home / Self Care | Payer: Medicaid Other | Attending: Obstetrics and Gynecology | Admitting: Obstetrics and Gynecology

## 2022-08-08 ENCOUNTER — Encounter (HOSPITAL_COMMUNITY): Payer: Self-pay | Admitting: *Deleted

## 2022-08-08 VITALS — BP 102/76 | HR 105 | Wt 166.0 lb

## 2022-08-08 DIAGNOSIS — Z3A39 39 weeks gestation of pregnancy: Secondary | ICD-10-CM

## 2022-08-08 DIAGNOSIS — O98211 Gonorrhea complicating pregnancy, first trimester: Secondary | ICD-10-CM

## 2022-08-08 DIAGNOSIS — N179 Acute kidney failure, unspecified: Secondary | ICD-10-CM | POA: Diagnosis present

## 2022-08-08 DIAGNOSIS — A6000 Herpesviral infection of urogenital system, unspecified: Secondary | ICD-10-CM | POA: Diagnosis present

## 2022-08-08 DIAGNOSIS — O99892 Other specified diseases and conditions complicating childbirth: Secondary | ICD-10-CM | POA: Diagnosis present

## 2022-08-08 DIAGNOSIS — O358XX0 Maternal care for other (suspected) fetal abnormality and damage, not applicable or unspecified: Principal | ICD-10-CM | POA: Diagnosis present

## 2022-08-08 DIAGNOSIS — A749 Chlamydial infection, unspecified: Secondary | ICD-10-CM

## 2022-08-08 DIAGNOSIS — O283 Abnormal ultrasonic finding on antenatal screening of mother: Secondary | ICD-10-CM

## 2022-08-08 DIAGNOSIS — O98811 Other maternal infectious and parasitic diseases complicating pregnancy, first trimester: Secondary | ICD-10-CM

## 2022-08-08 DIAGNOSIS — Z8619 Personal history of other infectious and parasitic diseases: Secondary | ICD-10-CM | POA: Diagnosis present

## 2022-08-08 DIAGNOSIS — O9832 Other infections with a predominantly sexual mode of transmission complicating childbirth: Secondary | ICD-10-CM | POA: Diagnosis present

## 2022-08-08 DIAGNOSIS — Q048 Other specified congenital malformations of brain: Secondary | ICD-10-CM

## 2022-08-08 DIAGNOSIS — O358XX1 Maternal care for other (suspected) fetal abnormality and damage, fetus 1: Secondary | ICD-10-CM

## 2022-08-08 DIAGNOSIS — Z7982 Long term (current) use of aspirin: Secondary | ICD-10-CM

## 2022-08-08 DIAGNOSIS — Z880 Allergy status to penicillin: Secondary | ICD-10-CM

## 2022-08-08 DIAGNOSIS — Z3480 Encounter for supervision of other normal pregnancy, unspecified trimester: Secondary | ICD-10-CM

## 2022-08-08 NOTE — Progress Notes (Signed)
PRENATAL VISIT NOTE  Subjective:  Andrea Vaughn is a 21 y.o. G2P0010 at [redacted]w[redacted]d being seen today for ongoing prenatal care.  She is currently monitored for the following issues for this low-risk pregnancy and has Supervision of other normal pregnancy, antepartum; ADHD (attention deficit hyperactivity disorder); Chlamydia infection affecting pregnancy; Gonorrhea affecting pregnancy; History of herpes genitalis; and Abnormal fetal ultrasound on their problem list.  Patient reports no complaints.  Contractions: Irregular. Vag. Bleeding: None.  Movement: Present. Denies leaking of fluid.   Foley bulb placement planned today and induction tomorrow at 39 1/7 due to enlarged cisternal magna and larger head size with baby with recommendation for delivery made by MFM.  The following portions of the patient's history were reviewed and updated as appropriate: allergies, current medications, past family history, past medical history, past social history, past surgical history and problem list.   Objective:   Vitals:   08/08/22 0934  BP: 102/76  Pulse: (!) 105  Weight: 166 lb (75.3 kg)    Fetal Status: Fetal Heart Rate (bpm): 148 Fundal Height: 38 cm Movement: Present  Presentation: Vertex  General:  Alert, oriented and cooperative. Patient is in no acute distress.  Skin: Skin is warm and dry. No rash noted.   Cardiovascular: Normal heart rate noted  Respiratory: Normal respiratory effort, no problems with respiration noted  Abdomen: Soft, gravid, appropriate for gestational age.  Pain/Pressure: Present     Pelvic: Cervical exam performed in the presence of a chaperone Dilation: 1.5 Effacement (%): 50 Station: -3  Extremities: Normal range of motion.  Edema: None  Mental Status: Normal mood and affect. Normal behavior. Normal judgment and thought content.   Procedure: Patient informed of Risks, benefits, and alternatives of procedure.   Procedure done to begin ripening of the cervix prior  to admission for induction of labor. Appropriate time out taken. The patient was placed in the lithotomy position and the cervix brought into view with sterile speculum. A ring forcep was used to guide the 3F foley", Cook Catheter through the internal os of the cervix. Foley Balloon filled with 30cc of normal saline. Plug inserted into end of the foley. Foley placed on tension and taped to medial thigh. All equipment was removed and accounted for. The patient tolerated the procedure well.   NST:  EFM Baseline: 150 bpm,  good variability, reactive NST Toco: irregular, every 3-6 minutes There were no signs of tachysystole or hypertonus.   Assessment and Plan:  Pregnancy: G2P0010 at [redacted]w[redacted]d 1. [redacted] weeks gestation of pregnancy - induction planned tomorrow due to enlarged cisterna magna, enlarged fetal head size and by MFM recommendation - successful foley bulb placement done today for cervical ripening. Induction of labor scheduled for tomorrow midnight. Reassuring FHR tracing with no concerns at present. Warning signs given to patient to include return to MAU for heavy vaginal bleeding, Rupture of membranes, painful uterine contractions q 5 mins or less, severe abdominal discomfort, decreased fetal movement. - on PNV and baby ASA  2. Supervision of other normal pregnancy, antepartum  3. Abnormal fetal ultrasound - post natal ultrasound or MRI will be needed  4. History of herpes genitalis -on Valtrex BID  5. Chlamydia infection affecting pregnancy in first trimester - treated with neg TOC  6. Gonorrhea affecting pregnancy in first trimester - treated with neg TOC   No follow-ups on file.  Future Appointments  Date Time Provider Department Center  08/09/2022 12:00 AM [facility] SCHED ROOM [facility] None  09/20/2022 10:15  AM Megan Salon, MD [facility]    Megan Salon, MD

## 2022-08-08 NOTE — Telephone Encounter (Signed)
Preadmission screen  

## 2022-08-09 ENCOUNTER — Encounter (HOSPITAL_COMMUNITY): Payer: Self-pay | Admitting: Obstetrics & Gynecology

## 2022-08-09 ENCOUNTER — Inpatient Hospital Stay (HOSPITAL_COMMUNITY)
Admission: AD | Admit: 2022-08-09 | Payer: Medicaid Other | Source: Home / Self Care | Admitting: Obstetrics and Gynecology

## 2022-08-09 ENCOUNTER — Inpatient Hospital Stay (HOSPITAL_COMMUNITY): Payer: Medicaid Other | Admitting: Anesthesiology

## 2022-08-09 ENCOUNTER — Other Ambulatory Visit: Payer: Self-pay

## 2022-08-09 ENCOUNTER — Inpatient Hospital Stay (HOSPITAL_COMMUNITY): Payer: Medicaid Other | Attending: Obstetrics and Gynecology

## 2022-08-09 DIAGNOSIS — O358XX0 Maternal care for other (suspected) fetal abnormality and damage, not applicable or unspecified: Secondary | ICD-10-CM | POA: Diagnosis present

## 2022-08-09 DIAGNOSIS — O99892 Other specified diseases and conditions complicating childbirth: Secondary | ICD-10-CM | POA: Diagnosis present

## 2022-08-09 DIAGNOSIS — Z3A39 39 weeks gestation of pregnancy: Secondary | ICD-10-CM | POA: Diagnosis not present

## 2022-08-09 DIAGNOSIS — A6000 Herpesviral infection of urogenital system, unspecified: Secondary | ICD-10-CM | POA: Diagnosis present

## 2022-08-09 DIAGNOSIS — N179 Acute kidney failure, unspecified: Secondary | ICD-10-CM | POA: Diagnosis present

## 2022-08-09 DIAGNOSIS — Z7982 Long term (current) use of aspirin: Secondary | ICD-10-CM | POA: Diagnosis not present

## 2022-08-09 DIAGNOSIS — O9832 Other infections with a predominantly sexual mode of transmission complicating childbirth: Secondary | ICD-10-CM | POA: Diagnosis present

## 2022-08-09 DIAGNOSIS — Q048 Other specified congenital malformations of brain: Secondary | ICD-10-CM

## 2022-08-09 DIAGNOSIS — Z880 Allergy status to penicillin: Secondary | ICD-10-CM | POA: Diagnosis not present

## 2022-08-09 LAB — CBC
HCT: 37 % (ref 36.0–46.0)
Hemoglobin: 12.4 g/dL (ref 12.0–15.0)
MCH: 30.4 pg (ref 26.0–34.0)
MCHC: 33.5 g/dL (ref 30.0–36.0)
MCV: 90.7 fL (ref 80.0–100.0)
Platelets: 187 10*3/uL (ref 150–400)
RBC: 4.08 MIL/uL (ref 3.87–5.11)
RDW: 13.2 % (ref 11.5–15.5)
WBC: 5.1 10*3/uL (ref 4.0–10.5)
nRBC: 0 % (ref 0.0–0.2)

## 2022-08-09 LAB — TYPE AND SCREEN
ABO/RH(D): AB POS
Antibody Screen: NEGATIVE

## 2022-08-09 LAB — RPR: RPR Ser Ql: NONREACTIVE

## 2022-08-09 MED ORDER — EPHEDRINE 5 MG/ML INJ: 10.0000 mg | INTRAVENOUS | Status: DC | PRN

## 2022-08-09 MED ORDER — OXYTOCIN BOLUS FROM INFUSION: 333.0000 mL | Freq: Once | INTRAVENOUS | Status: DC

## 2022-08-09 MED ORDER — LIDOCAINE HCL (PF) 1 % IJ SOLN: 30.0000 mL | INTRAMUSCULAR | Status: DC | PRN

## 2022-08-09 MED ORDER — PHENYLEPHRINE 80 MCG/ML (10ML) SYRINGE FOR IV PUSH (FOR BLOOD PRESSURE SUPPORT): 80.0000 ug | PREFILLED_SYRINGE | INTRAVENOUS | Status: DC | PRN

## 2022-08-09 MED ORDER — TERBUTALINE SULFATE 1 MG/ML IJ SOLN: 0.2500 mg | Freq: Once | INTRAMUSCULAR | Status: DC | PRN

## 2022-08-09 MED ORDER — MISOPROSTOL 50MCG HALF TABLET
50.0000 ug | ORAL_TABLET | Freq: Once | ORAL | Status: AC
  Administered 2022-08-09: 50 ug via ORAL
  Filled 2022-08-09: qty 1

## 2022-08-09 MED ORDER — OXYCODONE-ACETAMINOPHEN 5-325 MG PO TABS: 2.0000 | ORAL_TABLET | ORAL | Status: DC | PRN

## 2022-08-09 MED ORDER — ACETAMINOPHEN 325 MG PO TABS: 650.0000 mg | ORAL_TABLET | ORAL | Status: DC | PRN

## 2022-08-09 MED ORDER — OXYCODONE-ACETAMINOPHEN 5-325 MG PO TABS: 1.0000 | ORAL_TABLET | ORAL | Status: DC | PRN

## 2022-08-09 MED ORDER — MISOPROSTOL 25 MCG QUARTER TABLET
25.0000 ug | ORAL_TABLET | Freq: Once | ORAL | Status: AC
  Administered 2022-08-09: 25 ug via VAGINAL
  Filled 2022-08-09: qty 1

## 2022-08-09 MED ORDER — FENTANYL CITRATE (PF) 100 MCG/2ML IJ SOLN
100.0000 ug | INTRAMUSCULAR | Status: DC | PRN
  Administered 2022-08-09 (×2): 100 ug via INTRAVENOUS
  Filled 2022-08-09 (×2): qty 2

## 2022-08-09 MED ORDER — FENTANYL-BUPIVACAINE-NACL 0.5-0.125-0.9 MG/250ML-% EP SOLN
12.0000 mL/h | EPIDURAL | Status: DC | PRN
  Administered 2022-08-10: 12 mL/h via EPIDURAL
  Filled 2022-08-09 (×3): qty 250

## 2022-08-09 MED ORDER — OXYTOCIN-SODIUM CHLORIDE 30-0.9 UT/500ML-% IV SOLN: 2.5000 [IU]/h | INTRAVENOUS | Status: DC

## 2022-08-09 MED ORDER — LACTATED RINGERS IV SOLN
500.0000 mL | INTRAVENOUS | Status: DC | PRN
  Administered 2022-08-10: 500 mL via INTRAVENOUS

## 2022-08-09 MED ORDER — VALACYCLOVIR HCL 500 MG PO TABS
500.0000 mg | ORAL_TABLET | Freq: Two times a day (BID) | ORAL | Status: DC
  Administered 2022-08-09 – 2022-08-10 (×3): 500 mg via ORAL
  Filled 2022-08-09 (×3): qty 1

## 2022-08-09 MED ORDER — OXYTOCIN-SODIUM CHLORIDE 30-0.9 UT/500ML-% IV SOLN
1.0000 m[IU]/min | INTRAVENOUS | Status: DC
  Administered 2022-08-10: 2 m[IU]/min via INTRAVENOUS

## 2022-08-09 MED ORDER — ONDANSETRON HCL 4 MG/2ML IJ SOLN
4.0000 mg | Freq: Four times a day (QID) | INTRAMUSCULAR | Status: DC | PRN
  Administered 2022-08-10: 4 mg via INTRAVENOUS
  Filled 2022-08-09: qty 2

## 2022-08-09 MED ORDER — LACTATED RINGERS IV SOLN: INTRAVENOUS | Status: DC

## 2022-08-09 MED ORDER — LIDOCAINE HCL (PF) 1 % IJ SOLN
INTRAMUSCULAR | Status: DC | PRN
  Administered 2022-08-09: 10 mL via EPIDURAL
  Administered 2022-08-09: 2 mL via EPIDURAL

## 2022-08-09 MED ORDER — LACTATED RINGERS IV SOLN
500.0000 mL | Freq: Once | INTRAVENOUS | Status: AC
  Administered 2022-08-09: 500 mL via INTRAVENOUS

## 2022-08-09 MED ORDER — DIPHENHYDRAMINE HCL 50 MG/ML IJ SOLN: 12.5000 mg | INTRAMUSCULAR | Status: DC | PRN

## 2022-08-09 MED ORDER — OXYTOCIN-SODIUM CHLORIDE 30-0.9 UT/500ML-% IV SOLN
1.0000 m[IU]/min | INTRAVENOUS | Status: DC
  Administered 2022-08-09: 1 m[IU]/min via INTRAVENOUS
  Filled 2022-08-09: qty 500

## 2022-08-09 MED ORDER — SOD CITRATE-CITRIC ACID 500-334 MG/5ML PO SOLN
30.0000 mL | ORAL | Status: DC | PRN
  Filled 2022-08-09: qty 30

## 2022-08-09 MED ORDER — FENTANYL-BUPIVACAINE-NACL 0.5-0.125-0.9 MG/250ML-% EP SOLN
EPIDURAL | Status: DC | PRN
  Administered 2022-08-09: 12 mL/h via EPIDURAL

## 2022-08-09 NOTE — H&P (Cosign Needed Addendum)
OBSTETRIC ADMISSION HISTORY AND PHYSICAL  Andrea Vaughn is a 21 y.o. female G2P0010 with IUP at 110w1d presenting for IOL due to enlarged cisterna magna. She reports +FMs. No LOF, VB, blurry vision, headaches, peripheral edema, or RUQ pain. She plans on breast pump feeding. She requests POPs for birth control.  Dating: By LMP --->  Estimated Date of Delivery: 08/15/22  Sono:    @[redacted]w[redacted]d , normal anatomy, Cephalic presentation, 2877g, , EFW 6lbs 5oz   Prenatal History/Complications: HSV - on Valtrex.  Chlamydia and Gonorrhea infection in first trimester treated.   Enlarged fetal cisterna magna, plan for post partum 23%JSE or MRI.  Past Medical History: Past Medical History:  Diagnosis Date   ADHD (attention deficit hyperactivity disorder)    Genital herpes in women    Seasonal allergies     Past Surgical History: Past Surgical History:  Procedure Laterality Date   NO PAST SURGERIES      Obstetrical History: OB History     Gravida  2   Para      Term      Preterm      AB  1   Living         SAB  1   IAB      Ectopic      Multiple      Live Births              Social History: Social History   Socioeconomic History   Marital status: Single    Spouse name: Not on file   Number of children: Not on file   Years of education: Not on file   Highest education level: Not on file  Occupational History   Not on file  Tobacco Use   Smoking status: Never    Passive exposure: Never   Smokeless tobacco: Never  Vaping Use   Vaping Use: Never used  Substance and Sexual Activity   Alcohol use: Not Currently    Comment: socially   Drug use: Not Currently    Types: Marijuana    Comment: last used 1 month ago   Sexual activity: Yes    Birth control/protection: None  Other Topics Concern   Not on file  Social History Narrative   Not on file   Social Determinants of Health   Financial Resource Strain: Low Risk  (01/18/2022)   Overall Financial  Resource Strain (CARDIA)    Difficulty of Paying Living Expenses: Not hard at all  Food Insecurity: No Food Insecurity (01/18/2022)   Hunger Vital Sign    Worried About Running Out of Food in the Last Year: Never true    Ran Out of Food in the Last Year: Never true  Transportation Needs: No Transportation Needs (01/18/2022)   PRAPARE - 03/20/2022 (Medical): No    Lack of Transportation (Non-Medical): No  Physical Activity: Inactive (01/18/2022)   Exercise Vital Sign    Days of Exercise per Week: 0 days    Minutes of Exercise per Session: 0 min  Stress: No Stress Concern Present (01/18/2022)   03/20/2022 of Occupational Health - Occupational Stress Questionnaire    Feeling of Stress : Not at all  Social Connections: Moderately Isolated (01/18/2022)   Social Connection and Isolation Panel [NHANES]    Frequency of Communication with Friends and Family: More than three times a week    Frequency of Social Gatherings with Friends and Family: More than three times a week  Attends Religious Services: 1 to 4 times per year    Active Member of Clubs or Organizations: No    Attends Archivist Meetings: Never    Marital Status: Never married    Family History: Family History  Problem Relation Age of Onset   Anemia Mother    Healthy Father    Cancer Maternal Grandmother     Allergies: Allergies  Allergen Reactions   Penicillins Rash    " antibiotic" mom unsure of med.  Fine rash with Amoxicillin     Medications Prior to Admission  Medication Sig Dispense Refill Last Dose   aspirin EC 81 MG tablet Take 1 tablet (81 mg total) by mouth daily. Swallow whole. 30 tablet 11 08/09/2022   Prenatal Vit-Fe Fumarate-FA (PRENATAL VITAMINS) 28-0.8 MG TABS Take 1 tablet by mouth daily. 90 tablet 0 08/09/2022   valACYclovir (VALTREX) 500 MG tablet TAKE 1 TABLET BY MOUTH TWICE A DAY 60 tablet 1 08/09/2022     Review of Systems:  All systems reviewed and  negative except as stated in HPI  PE: Blood pressure 116/70, pulse 87, temperature 98.4 F (36.9 C), temperature source Oral, resp. rate 20, height 5\' 4"  (1.626 m), weight 73.8 kg, last menstrual period 11/08/2021, unknown if currently breastfeeding. General appearance: alert, cooperative, appears stated age, and no distress Lungs: regular rate and effort Heart: regular rate  Abdomen: soft, non-tender Extremities: Homans sign is negative, no sign of DVT Presentation: cephalic EFM: 440 bpm, moderate variability, + accels, - decels Toco: Intermittent Dilation: 2 Effacement (%): 60 Station: -2 Exam by:: DCallaway, RN SVE: n/a  Prenatal labs: ABO, Rh: AB/Positive/-- (03/07 1540) Antibody: Negative (03/07 1540) Rubella: 13.00 (03/07 1540) RPR: Non Reactive (07/13 0820)  HBsAg: Negative (03/07 1540)  HIV: Non Reactive (07/13 0820)  GBS: Negative/-- (09/10 0000)  2 hr GTT Negative  Prenatal Transfer Tool  Maternal Diabetes: No Genetic Screening: Normal Maternal Ultrasounds/Referrals: Other: Enlarged cisterna magna Fetal Ultrasounds or other Referrals:  Referred to Materal Fetal Medicine  Maternal Substance Abuse:  No, prior marijuana use before pregnancy Significant Maternal Medications:  Valtrex Significant Maternal Lab Results: Group B Strep negative  No results found for this or any previous visit (from the past 24 hour(s)).  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 G2P0010 at [redacted]w[redacted]d here for IOL due to enlarged fetal cisterna magna.   1. Labor: Discussed induction management  with patient. Placed repeat FB. Cyto 64mcg buccal, and 25 vaginal administered. Recheck cervical exam when FB displaces. 2. FWB: Cat 1 3. Pain: Plan for IV pain medication, until patient feels epidural is necessary 4. GBS: negative 5. HSV: continue Valtrex   Plan: Admit to L&D  Salvadore Oxford, MD  08/09/2022, 3:11 AM   Fellow Attestation  I saw and evaluated the patient, performing the key elements of the service.I  personally performed or re-performed the history, physical exam, and medical decision making activities of this service and have verified that the service and findings are accurately documented in the resident's note. I developed the management plan that is described in the resident's note, and I agree with the content,  with my edits above.    Derrel Nip, MD OB Fellow

## 2022-08-09 NOTE — Progress Notes (Signed)
Andrea Vaughn is a 21 y.o. G2P0010 at [redacted]w[redacted]d by LMP admitted for induction of labor due to enlarged cisterna magnum.  Subjective: Doing well, has epidural and has been resting  Objective: BP 103/60   Pulse 71   Temp 97.7 F (36.5 C) (Oral)   Resp 16   Ht 5\' 4"  (1.626 m)   Wt 73.8 kg   LMP 11/08/2021   SpO2 99%   BMI 27.91 kg/m  No intake/output data recorded. No intake/output data recorded.  FHT:  FHR: 130 bpm, variability: moderate,  accelerations:  Present,  decelerations:  Absent UC:   irregular, every q1-2 minutes SVE:   Dilation: 6 Effacement (%): 70 Station: -1 Exam by:: Maye Hides CNM  Labs: Lab Results  Component Value Date   WBC 5.1 08/09/2022   HGB 12.4 08/09/2022   HCT 37.0 08/09/2022   MCV 90.7 08/09/2022   PLT 187 08/09/2022    Assessment / Plan: Patient doing well, AROm clear fluid now  Labor:  progressing normally Preeclampsia:   NA Fetal Wellbeing:  Category I Pain Control:  Epidural I/D:  GBS neg Anticipated MOD:  NSVD  Starr Lake, CNM 08/09/2022, 6:09 PM

## 2022-08-09 NOTE — Anesthesia Preprocedure Evaluation (Addendum)
Anesthesia Evaluation  Patient identified by MRN, date of birth, ID band Patient awake    Reviewed: Allergy & Precautions, Patient's Chart, lab work & pertinent test results  Airway Mallampati: II  TM Distance: >3 FB Neck ROM: Full    Dental no notable dental hx.    Pulmonary neg pulmonary ROS,    Pulmonary exam normal breath sounds clear to auscultation       Cardiovascular negative cardio ROS Normal cardiovascular exam Rhythm:Regular Rate:Normal     Neuro/Psych negative neurological ROS  negative psych ROS   GI/Hepatic negative GI ROS, Neg liver ROS,   Endo/Other  negative endocrine ROS  Renal/GU negative Renal ROS  negative genitourinary   Musculoskeletal negative musculoskeletal ROS (+)   Abdominal   Peds negative pediatric ROS (+)  Hematology negative hematology ROS (+) Hb 12.4, plt 187   Anesthesia Other Findings   Reproductive/Obstetrics (+) Pregnancy                             Anesthesia Physical Anesthesia Plan  ASA: 2  Anesthesia Plan: Epidural   Post-op Pain Management:    Induction:   PONV Risk Score and Plan: 2  Airway Management Planned: Natural Airway  Additional Equipment: None  Intra-op Plan:   Post-operative Plan:   Informed Consent: I have reviewed the patients History and Physical, chart, labs and discussed the procedure including the risks, benefits and alternatives for the proposed anesthesia with the patient or authorized representative who has indicated his/her understanding and acceptance.       Plan Discussed with:   Anesthesia Plan Comments: (UPDATE 08/10/22 9:25 PM: Patient to go to OR for C section due to arrest of dilation. Labor epidural in place and functioning well. Will plan to use epidural for surgical anesthesia. Discussed with patient. )       Anesthesia Quick Evaluation

## 2022-08-09 NOTE — Anesthesia Procedure Notes (Signed)
Epidural Patient location during procedure: OB Start time: 08/09/2022 3:58 PM End time: 08/09/2022 4:07 PM  Staffing Anesthesiologist: Pervis Hocking, DO Performed: anesthesiologist   Preanesthetic Checklist Completed: patient identified, IV checked, risks and benefits discussed, monitors and equipment checked, pre-op evaluation and timeout performed  Epidural Patient position: sitting Prep: DuraPrep and site prepped and draped Patient monitoring: continuous pulse ox, blood pressure, heart rate and cardiac monitor Approach: midline Location: L3-L4 Injection technique: LOR air  Needle:  Needle type: Tuohy  Needle gauge: 17 G Needle length: 9 cm Needle insertion depth: 6 cm Catheter type: closed end flexible Catheter size: 19 Gauge Catheter at skin depth: 11 cm Test dose: negative  Assessment Sensory level: T8 Events: blood not aspirated, injection not painful, no injection resistance, no paresthesia and negative IV test  Additional Notes Patient identified. Risks/Benefits/Options discussed with patient including but not limited to bleeding, infection, nerve damage, paralysis, failed block, incomplete pain control, headache, blood pressure changes, nausea, vomiting, reactions to medication both or allergic, itching and postpartum back pain. Confirmed with bedside nurse the patient's most recent platelet count. Confirmed with patient that they are not currently taking any anticoagulation, have any bleeding history or any family history of bleeding disorders. Patient expressed understanding and wished to proceed. All questions were answered. Sterile technique was used throughout the entire procedure. Please see nursing notes for vital signs. Test dose was given through epidural catheter and negative prior to continuing to dose epidural or start infusion. Warning signs of high block given to the patient including shortness of breath, tingling/numbness in hands, complete motor  block, or any concerning symptoms with instructions to call for help. Patient was given instructions on fall risk and not to get out of bed. All questions and concerns addressed with instructions to call with any issues or inadequate analgesia.  Reason for block:procedure for pain

## 2022-08-09 NOTE — Progress Notes (Signed)
Labor Progress Note  [name] is a 21 y.o. G2P0010 at [redacted]w[redacted]d presented for IOL enlarged for cisterna magna.  S: Doing well. Resting comfortably with epidural.    O:  BP (!) 106/57   Pulse 80   Temp 97.7 F (36.5 C) (Oral)   Resp 16   Ht 5\' 4"  (1.626 m)   Wt 73.8 kg   LMP 11/08/2021   SpO2 99%   BMI 27.91 kg/m  EFM: 135/moderate variability/accelerations: present; decelerations: absent  CVE: Dilation: 7 Effacement (%): 70 Cervical Position: Posterior Station: 0 Presentation: Vertex Exam by:: Alexis Martinique, RN   A&P: 21 y.o. G2P0010 [redacted]w[redacted]d IOL as above #Labor: Progressing well. Pit titrated to 6. Continue as protocol to max of 40.  #Pain:  #FWB: Cat 1 #GBS negative   Delma Officer, Student-PA 9:06 PM

## 2022-08-09 NOTE — Progress Notes (Addendum)
Patient ID: Andrea Vaughn, female   DOB: 12-30-2000, 21 y.o.   MRN: [id number] Andrea Vaughn is a 21 y.o. G2P0010 at [redacted]w[redacted]d admitted for  enlarged cisterna magna .  Subjective: beginning to get uncomfortable w/ contractions  Objective: BP 117/73   Pulse 89   Temp 98 F (36.7 C) (Oral)   Resp 16   Ht 5\' 4"  (1.626 m)   Wt 73.8 kg   LMP 11/08/2021   BMI 27.91 kg/m  No intake/output data recorded.  FHR baseline 125 bpm, Variability: moderate, Accelerations:present, Decelerations:  Absent Toco: irregular   SVE:   Dilation: 4 Effacement (%): 60 Station: -3 Exam by:: Maye Hides CNM  Pitocin @ 2 mu/min  Labs: Lab Results  Component Value Date   WBC 5.1 08/09/2022   HGB 12.4 08/09/2022   HCT 37.0 08/09/2022   MCV 90.7 08/09/2022   PLT 187 08/09/2022    Assessment / Plan: IOL d/t enlarged cisterna magna, s/p foley bulb (out at 0600). Pitocin: currently at 2 mu/min.   Labor: early Fetal Wellbeing:  Category I Pain Control:  labor support without medications at this time, planning epidural later.  Pre-eclampsia: N/A I/D:  GBS neg Anticipated MOD: NSVB  Owens Loffler SNM 08/09/2022, 1:07 PM   Attestation of Supervision of Student:  I confirm that I have verified the information documented in the nurse midwife student's note and that I have also personally reperformed the history, physical exam and all medical decision making activities.  I have verified that all services and findings are accurately documented in this student's note; and I agree with management and plan as outlined in the documentation. I have also made any necessary editorial changes.   Starr Lake, [place] for [employer], [place] [facility] 08/09/2022 2:54 PM

## 2022-08-09 NOTE — MAU Note (Signed)
Pt says was at [facility] - with Dr Sabra Heck at [facility] foley bulb  It came out at [facility] - some  Has HX of HSV- takes Valtrex - never has an outbreak - positive blood test . Denies any S/S now

## 2022-08-09 NOTE — Progress Notes (Addendum)
Patient ID: Andrea Vaughn, female   DOB: September 22, 2001, 21 y.o.   MRN: [id number] Andrea Vaughn is a 21 y.o. G2P0010 at [redacted]w[redacted]d admitted for  enlarged cisterna magna.  Subjective: beginning to get uncomfortable w/ contractions  Objective: BP (!) 98/56   Pulse 82   Temp 98.2 F (36.8 C) (Oral)   Resp 16   Ht 5\' 4"  (1.626 m)   Wt 73.8 kg   LMP 11/08/2021   BMI 27.91 kg/m  No intake/output data recorded.  FHR baseline 130 bpm, Variability: moderate, Accelerations:present, Decelerations:  Absent Toco: q 2 mins   SVE:   Dilation: 4 Effacement (%): 70 Station: -2 Exam by:: Dr. Caron Presume  Labs: Lab Results  Component Value Date   WBC 5.1 08/09/2022   HGB 12.4 08/09/2022   HCT 37.0 08/09/2022   MCV 90.7 08/09/2022   PLT 187 08/09/2022    Assessment / Plan: IOL d/t enlarged cisterna magna, s/p foley bulb (out at 0600). Pitocin: will begin per protocol.   Labor: early Fetal Wellbeing:  Category I Pain Control:  requesting IV pain meds Pre-eclampsia: N/A I/D:  GBS neg Anticipated MOD: NSVB  Owens Loffler, SNM  08/09/2022, 9:17 AM   Attestation of Supervision of Student:  I confirm that I have verified the information documented in the medical student's note and that I have also personally reperformed the history, physical exam and all medical decision making activities.  I have verified that all services and findings are accurately documented in this student's note; and I agree with management and plan as outlined in the documentation. I have also made any necessary editorial changes.   Starr Lake, [place] for [employer], [facility] 08/09/2022 10:57 AM

## 2022-08-10 ENCOUNTER — Encounter (HOSPITAL_COMMUNITY)
Admission: AD | Disposition: A | Discharge status: Home / Self Care | Payer: Self-pay | Source: Home / Self Care | Attending: Obstetrics and Gynecology

## 2022-08-10 DIAGNOSIS — Z3A39 39 weeks gestation of pregnancy: Secondary | ICD-10-CM

## 2022-08-10 LAB — COMPREHENSIVE METABOLIC PANEL
ALT: 10 U/L (ref 0–44)
AST: 26 U/L (ref 15–41)
Albumin: 2.3 g/dL — ABNORMAL LOW (ref 3.5–5.0)
Alkaline Phosphatase: 288 U/L — ABNORMAL HIGH (ref 38–126)
Anion gap: 10 (ref 5–15)
BUN: 5 mg/dL — ABNORMAL LOW (ref 6–20)
CO2: 21 mmol/L — ABNORMAL LOW (ref 22–32)
Calcium: 8.8 mg/dL — ABNORMAL LOW (ref 8.9–10.3)
Chloride: 105 mmol/L (ref 98–111)
Creatinine, Ser: 1.52 mg/dL — ABNORMAL HIGH (ref 0.44–1.00)
GFR, Estimated: 50 mL/min — ABNORMAL LOW (ref >60)
Glucose, Bld: 99 mg/dL (ref 70–99)
Potassium: 3.9 mmol/L (ref 3.5–5.1)
Sodium: 136 mmol/L (ref 135–145)
Total Bilirubin: 1 mg/dL (ref 0.3–1.2)
Total Protein: 5.5 g/dL — ABNORMAL LOW (ref 6.5–8.1)

## 2022-08-10 SURGERY — Surgical Case
Anesthesia: Epidural

## 2022-08-10 MED ORDER — GENTAMICIN SULFATE 40 MG/ML IJ SOLN
5.0000 mg/kg | INTRAVENOUS | Status: DC
  Administered 2022-08-10: 370 mg via INTRAVENOUS
  Filled 2022-08-10: qty 9.25

## 2022-08-10 MED ORDER — LIDOCAINE-EPINEPHRINE (PF) 2 %-1:200000 IJ SOLN
INTRAMUSCULAR | Status: DC | PRN
  Administered 2022-08-10: 8 mL via EPIDURAL

## 2022-08-10 MED ORDER — CEFAZOLIN SODIUM-DEXTROSE 2-4 GM/100ML-% IV SOLN
2.0000 g | Freq: Three times a day (TID) | INTRAVENOUS | Status: DC
  Administered 2022-08-10: 2 g via INTRAVENOUS
  Filled 2022-08-10 (×2): qty 100

## 2022-08-10 MED ORDER — ONDANSETRON HCL 4 MG/2ML IJ SOLN
INTRAMUSCULAR | Status: AC
  Filled 2022-08-10: qty 2

## 2022-08-10 MED ORDER — ONDANSETRON HCL 4 MG/2ML IJ SOLN
INTRAMUSCULAR | Status: DC | PRN
  Administered 2022-08-10: 4 mg via INTRAVENOUS

## 2022-08-10 MED ORDER — DEXAMETHASONE SODIUM PHOSPHATE 10 MG/ML IJ SOLN
INTRAMUSCULAR | Status: AC
  Filled 2022-08-10: qty 2

## 2022-08-10 MED ORDER — MORPHINE SULFATE (PF) 0.5 MG/ML IJ SOLN
INTRAMUSCULAR | Status: AC
  Filled 2022-08-10: qty 10

## 2022-08-10 MED ORDER — PHENYLEPHRINE 80 MCG/ML (10ML) SYRINGE FOR IV PUSH (FOR BLOOD PRESSURE SUPPORT)
PREFILLED_SYRINGE | INTRAVENOUS | Status: AC
  Filled 2022-08-10: qty 10

## 2022-08-10 MED ORDER — SODIUM CHLORIDE 0.9 % IV SOLN
500.0000 mg | INTRAVENOUS | Status: AC
  Administered 2022-08-10: 500 mg via INTRAVENOUS
  Filled 2022-08-10: qty 5

## 2022-08-10 MED ORDER — ACETAMINOPHEN 500 MG PO TABS
1000.0000 mg | ORAL_TABLET | Freq: Once | ORAL | Status: AC
  Administered 2022-08-10: 1000 mg via ORAL
  Filled 2022-08-10: qty 2

## 2022-08-10 MED ORDER — SOD CITRATE-CITRIC ACID 500-334 MG/5ML PO SOLN
30.0000 mL | ORAL | Status: AC
  Administered 2022-08-10: 30 mL via ORAL

## 2022-08-10 SURGICAL SUPPLY — 36 items
BENZOIN TINCTURE PRP APPL 2/3 (GAUZE/BANDAGES/DRESSINGS) ×1 IMPLANT
CHLORAPREP W/TINT 26ML (MISCELLANEOUS) ×2 IMPLANT
CLAMP UMBILICAL CORD (MISCELLANEOUS) ×1 IMPLANT
CLOTH BEACON ORANGE TIMEOUT ST (SAFETY) ×1 IMPLANT
DERMABOND ADVANCED .7 DNX12 (GAUZE/BANDAGES/DRESSINGS) ×1 IMPLANT
DRSG OPSITE POSTOP 4X10 (GAUZE/BANDAGES/DRESSINGS) ×1 IMPLANT
ELECT REM PT RETURN 9FT ADLT (ELECTROSURGICAL) ×1
ELECTRODE REM PT RTRN 9FT ADLT (ELECTROSURGICAL) ×1 IMPLANT
EXTRACTOR VACUUM KIWI (MISCELLANEOUS) IMPLANT
GLOVE BIOGEL PI IND STRL 7.0 (GLOVE) ×3 IMPLANT
GLOVE ECLIPSE 6.5 STRL STRAW (GLOVE) ×1 IMPLANT
GOWN STRL REUS W/TWL LRG LVL3 (GOWN DISPOSABLE) ×3 IMPLANT
KIT ABG SYR 3ML LUER SLIP (SYRINGE) IMPLANT
NDL HYPO 25X5/8 SAFETYGLIDE (NEEDLE) IMPLANT
NEEDLE HYPO 25X5/8 SAFETYGLIDE (NEEDLE) IMPLANT
NS IRRIG 1000ML POUR BTL (IV SOLUTION) ×1 IMPLANT
PACK C SECTION WH (CUSTOM PROCEDURE TRAY) ×1 IMPLANT
PAD ABD 7.5X8 STRL (GAUZE/BANDAGES/DRESSINGS) ×1 IMPLANT
PAD OB MATERNITY 4.3X12.25 (PERSONAL CARE ITEMS) ×1 IMPLANT
RTRCTR C-SECT PINK 25CM LRG (MISCELLANEOUS) ×1 IMPLANT
SPONGE LAP 18X18 X RAY DECT (DISPOSABLE) IMPLANT
STRIP CLOSURE SKIN 1/2X4 (GAUZE/BANDAGES/DRESSINGS) IMPLANT
SUT PLAIN 0 NONE (SUTURE) IMPLANT
SUT PLAIN 2 0 XLH (SUTURE) IMPLANT
SUT VIC AB 0 CT1 27 (SUTURE) ×2
SUT VIC AB 0 CT1 27XBRD ANBCTR (SUTURE) ×2 IMPLANT
SUT VIC AB 0 CTX 36 (SUTURE) ×3
SUT VIC AB 0 CTX36XBRD ANBCTRL (SUTURE) ×3 IMPLANT
SUT VIC AB 2-0 CT1 27 (SUTURE) ×1
SUT VIC AB 2-0 CT1 TAPERPNT 27 (SUTURE) ×1 IMPLANT
SUT VIC AB 3-0 SH 27 (SUTURE) ×1
SUT VIC AB 3-0 SH 27XBRD (SUTURE) ×1 IMPLANT
SUT VIC AB 4-0 KS 27 (SUTURE) ×1 IMPLANT
TOWEL OR 17X24 6PK STRL BLUE (TOWEL DISPOSABLE) ×1 IMPLANT
TRAY FOLEY W/BAG SLVR 14FR LF (SET/KITS/TRAYS/PACK) IMPLANT
WATER STERILE IRR 1000ML POUR (IV SOLUTION) ×1 IMPLANT

## 2022-08-10 NOTE — Discharge Summary (Signed)
Postpartum Discharge Summary  Date of Service updated 08/13/22    Patient Name: Andrea Vaughn DOB: [birth year] MRN: [id number]  Date of admission: 08/08/2022 Delivery date:08/11/2022  Delivering provider: Janyth Pupa  Date of discharge: 08/13/2022  Admitting diagnosis: Large cisterna magna ([place]) [Q04.8] Intrauterine pregnancy: [redacted]w[redacted]d    Secondary diagnosis:  Principal Problem:   Delivery by cesarean section using low vertical uterine incision Active Problems:   Supervision of other normal pregnancy, antepartum   History of herpes genitalis   Large cisterna magna ([place]   AKI (acute kidney injury) ([place]  Additional problems: N/a    Discharge diagnosis: Term Pregnancy Delivered                                              Post partum procedures: n/a Augmentation: AROM, Pitocin, Cytotec, and IP Foley Complications: None  Hospital course: Induction of Labor With Cesarean Section   21y.o. yo G2P1011 at 349w3das admitted to the hospital 08/08/2022 for induction of labor. Patient had a labor course significant for augmentation with eventual arrest of dilation. The patient went for cesarean section due to Arrest of Dilation. Delivery details are as follows: Membrane Rupture Time/Date: 5:45 PM ,08/09/2022   Delivery Method:C-Section, Low Transverse  Details of operation can be found in separate operative Note.  Patient had an postpartum course complicated  by acute kidney injury prior to c/s likely 2/2 fetal positioning. Creatinine 1.5 before delivery, 2.1 after, then improved to 1.1 on POD 1. . She is ambulating, tolerating a regular diet, passing flatus, and urinating well.  Patient is discharged home in stable condition on 08/13/22.      Newborn Data: Birth date:08/11/2022  Birth time:12:04 AM  Gender:Female  Living status:Living  Apgars:5 ,9  Weight:4060 g                                Magnesium Sulfate received: No BMZ received: No Rhophylac:N/A MMR:N/A T-DaP:Given  prenatally Flu: N/A Transfusion:No  Physical exam  Vitals:   08/11/22 1600 08/12/22 0539 08/12/22 1445 08/13/22 0550  BP: 136/76 (!) 97/57 103/67 103/64  Pulse: 64 68 62 64  Resp: '18 18 17 16  ' Temp: 98 F (36.7 C) 98 F (36.7 C) 98.1 F (36.7 C) 98 F (36.7 C)  TempSrc:  Oral Oral Oral  SpO2: 98% 100% 100% 100%  Weight:      Height:       General: alert, cooperative, and no distress Lochia: appropriate Uterine Fundus: firm Incision: Dressing is clean, dry, and intact DVT Evaluation: Calf/Ankle edema is present Labs: Lab Results  Component Value Date   WBC 18.4 (H) 08/11/2022   HGB 12.8 08/11/2022   HCT 36.0 08/11/2022   MCV 87.6 08/11/2022   PLT 158 08/11/2022      Latest Ref Rng & Units 08/12/2022    4:50 AM  CMP  Glucose 70 - 99 mg/dL 79   BUN 6 - 20 mg/dL 11   Creatinine 0.44 - 1.00 mg/dL 1.14   Sodium 135 - 145 mmol/L 137   Potassium 3.5 - 5.1 mmol/L 3.9   Chloride 98 - 111 mmol/L 111   CO2 22 - 32 mmol/L 21   Calcium 8.9 - 10.3 mg/dL 8.6    Edinburgh Score:    08/11/2022  4:00 AM  Edinburgh Postnatal Depression Scale Screening Tool  I have been able to laugh and see the funny side of things. 0  I have looked forward with enjoyment to things. 0  I have blamed myself unnecessarily when things went wrong. 1  I have been anxious or worried for no good reason. 1  I have felt scared or panicky for no good reason. 0  Things have been getting on top of me. 0  I have been so unhappy that I have had difficulty sleeping. 0  I have felt sad or miserable. 1  I have been so unhappy that I have been crying. 1  The thought of harming myself has occurred to me. 0  Edinburgh Postnatal Depression Scale Total 4     After visit meds:  Allergies as of 08/13/2022       Reactions   Penicillins Rash   " antibiotic" mom unsure of med.  Fine rash with Amoxicillin        Medication List     STOP taking these medications    aspirin EC 81 MG tablet   Prenatal  Vitamins 28-0.8 MG Tabs   valACYclovir 500 MG tablet Commonly known as: VALTREX       TAKE these medications    acetaminophen 325 MG tablet Commonly known as: TYLENOL Take 2 tablets (650 mg total) by mouth every 4 (four) hours as needed for mild pain (temperature > 101.5.).   ibuprofen 600 MG tablet Commonly known as: ADVIL Take 1 tablet (600 mg total) by mouth every 6 (six) hours.   oxyCODONE 5 MG immediate release tablet Commonly known as: Oxy IR/ROXICODONE Take 1-2 tablets (5-10 mg total) by mouth every 4 (four) hours as needed for moderate pain.   senna-docusate 8.6-50 MG tablet Commonly known as: Senokot-S Take 2 tablets by mouth daily.               Discharge Care Instructions  (From admission, onward)           Start     Ordered   08/13/22 0000  Discharge wound care:       Comments: C-section wound care: You may feel pain/discomfort/burning sensation for several weeks. Keep the wound area clean by washing it with mild soap and water. You don't need to scrub it. Often, just letting the water run over your wound in the shower is enough. We do not recommend creams as this can cause infection, but we do recommend oral medication (prescribed), pressure dressings and running warm water on the wound to help ease discomfort/burning pain.   08/13/22 0811             Discharge home in stable condition Infant Feeding: Bottle and Breast Infant Disposition:home with mother Discharge instruction: per After Visit Summary and Postpartum booklet. Activity: Advance as tolerated. Pelvic rest for 6 weeks.  Diet: routine diet Future Appointments: Future Appointments  Date Time Provider [place]  09/20/2022 10:15 AM Megan Salon, MD [facility]   Follow up Visit:  Message sent 08/13/22 to [facility]  Please schedule this patient for a In person postpartum visit in 6 weeks with the following provider: Any provider. Additional Postpartum F/U:Incision check 1  week  Low risk pregnancy complicated by:  n/a Delivery mode:  C-Section, Low Transverse  Anticipated Birth Control:  Depo   08/13/2022 Shelda Pal, DO

## 2022-08-10 NOTE — Progress Notes (Signed)
Patient ID: Andrea Vaughn, female   DOB: 2001/08/29, 21 y.o.   MRN: [id number]  Labor Progress Note Andrea Vaughn is a 21 y.o. G2P0010 at [redacted]w[redacted]d presented for IOL 2/2 enlarged cisterna magna on sonography  S: The patient reports increased pain in her back with her contractions, but has some relief by pressing her epidural button.   O:  BP 127/64   Pulse 90   Temp 98.2 F (36.8 C) (Oral)   Resp 18   Ht 5\' 4"  (1.626 m)   Wt 162 lb 9.6 oz (73.8 kg)   LMP 11/08/2021   SpO2 99%   BMI 27.91 kg/m   140 bpm/Moderate variability/ 15x15 accels/ None decels   CVE: Dilation: 10 (Reducible lip noted by Dr. Kerrie Pleasure.) Dilation Complete Date: 08/10/22 Dilation Complete Time: 0850 Effacement (%): 100 Cervical Position: Posterior Station: 0 Presentation: Vertex Exam by:: Dr. Kerrie Pleasure   A&P: 21 y.o. G2P0010 [redacted]w[redacted]d presenting for IOL 2/2 enlarged cisterna magna on sonography.  #Labor: Progressing well on Pit 2 mg and Cytotec. On cervical exam, the patient is complete and anterior lip is reducible with contractions. Discussed with the patient's nurse that if the patient has the urge to push, she can proceed if anterior lip remains reducible.  #Pain: Epidural #FWB: Cat I #GBS negative #HSV: continue Valtrex #Enlarged Cisterna magna: Cyst formation, recommend pediatrician follow up  Deloria Lair, DO 10:05 AM

## 2022-08-10 NOTE — Progress Notes (Signed)
Labor Progress Note Andrea Vaughn is a 21 y.o. G2P0010 at [redacted]w[redacted]d presented for IOL.  S: Has expressed frustration with her labor process.   O:  BP 118/74   Pulse (!) 112   Temp 99.4 F (37.4 C) (Axillary)   Resp 18   Ht 5\' 4"  (1.626 m)   Wt 73.8 kg   LMP 11/08/2021   SpO2 99%   BMI 27.91 kg/m  EFM: 140 bpm/moderate variability/+accels, no decels  CVE: Dilation: 9 (Swollen anterior lip) Dilation Complete Date: 08/10/22 Dilation Complete Time: 0850 Effacement (%): 100 Cervical Position: Posterior Station: 0 Presentation: Vertex Exam by:: Dr. Janus Molder   A&P: 21 y.o. G2P0010 [redacted]w[redacted]d here for IOL.  #Labor: Minor change in cervix. Significant swollen perineum and anterior lip. Pitocin now up to 12; continue to increase as tolerated. Can consider IUPC. Briefly discussed different mode of delivery. The patient wants to continue with a vaginal delivery. Encouraged her informed decision at this time. Continue to monitor temp with ROM coming up on 24 hours. #Pain: Epidural #FWB: Cat I  #GBS negative   Minta Fair Autry-Lott, DO 4:52 PM

## 2022-08-10 NOTE — Progress Notes (Signed)
At bedside to review management plan.  Pt has been on Pitocin all day, IUPC in place with inadequate contractions.  Cervical change has remained unchanged and Foley noted to have moderate hematuria.  Discussed concern for arrest of dilation.  Reviewed options for continue Pit to attempt to obtain adequate contractions vs proceeding with primary C-section.  Discussed with patient that based on prolonged labor, there is not guarantee that within a few hours she may not still be the same exam.  After much review, plan was made to proceed with primary C-section. Risk benefits and alternatives of cesarean section were discussed with the patient including but not limited to infection, bleeding, damage to bowel , bladder and baby with the need for further surgery. Pt voiced understanding and desires to proceed.    Janyth Pupa, DO Attending [place], [facility], [place] for [employer], [place]

## 2022-08-10 NOTE — Progress Notes (Signed)
Pharmacy Antibiotic Note  Andrea Vaughn is a 21 y.o. female admitted on 08/08/2022 for IOL at [redacted]w[redacted]d.  Pharmacy has been consulted for Gentamicin dosing for maternal temp during labor/ Triple I.  Plan: Gentamicin 5mg /kg (370mg ) IV q24h Will continue to follow  Height: 5\' 4"  (162.6 cm) Weight: 73.8 kg (162 lb 9.6 oz) IBW/kg (Calculated) : 54.7  Temp (24hrs), Avg:99.2 F (37.3 C), Min:98.2 F (36.8 C), Max:100.8 F (38.2 C)  Recent Labs  Lab 08/09/22 0257 08/10/22 1357  WBC 5.1  --   CREATININE  --  1.52*    Estimated Creatinine Clearance: 58.1 mL/min (A) (by C-G formula based on SCr of 1.52 mg/dL (H)).    Allergies  Allergen Reactions   Penicillins Rash    " antibiotic" mom unsure of med.  Fine rash with Amoxicillin     Antimicrobials this admission: Cefazolin 2 gram IV q8h (9/27>>   Thank you for allowing pharmacy to be a part of this patient's care.  Vernie Ammons 08/10/2022 6:56 PM

## 2022-08-10 NOTE — Progress Notes (Signed)
Patient ID: Andrea Vaughn, female   DOB: 06-11-2001, 21 y.o.   MRN: [id number] Andrea Vaughn is a 21 y.o. G2P0010 at [redacted]w[redacted]d admitted for induction of labor due to enlarged fetal cisterna magna  Subjective: no complaints, comfortable with epidural, and no urge to push  Objective: BP 122/82   Pulse 88   Temp 98.5 F (36.9 C) (Oral)   Resp 16   Ht 5\' 4"  (1.626 m)   Wt 73.8 kg   LMP 11/08/2021   SpO2 99%   BMI 27.91 kg/m  No intake/output data recorded.  FHR baseline 145 bpm, Variability: moderate, Accelerations:present, Decelerations:  Absent Toco: q 1-3 mins   SVE:   Dilation: 10 Effacement (%): 90 Station: Plus 2 Exam by:: Alexis Martinique RN  Pitocin off  Labs: Lab Results  Component Value Date   WBC 5.1 08/09/2022   HGB 12.4 08/09/2022   HCT 37.0 08/09/2022   MCV 90.7 08/09/2022   PLT 187 08/09/2022    Assessment / Plan: IOL d/t enlarged fetal cisterna magna, 10/100/+2 per RN @ [phone] w/ a lot of pain and urge to push when on side, started pushing @ 0228 in semi-fowler's, began to have lates and decreased variability- I came in @ 0252,  turned pit off, stopped pushing, pt repositioned, FHR improved to Cat 1 again. No urge to push now. Will labor down for now unless strong urge to push returns. Plan to resume pushing by 0500.   Labor: active Fetal Wellbeing:   now Cat 1 Pain Control:  epidural Pre-eclampsia: N/A I/D:  GBS neg Anticipated MOD: NSVB  Roma Schanz CNM, WHNP-BC 08/10/2022, 3:18 AM

## 2022-08-10 NOTE — Progress Notes (Signed)
Reviewed cervical exams overnight.   20:13: 7 cm 2230: 7.5 cm 0049: 9 cm 2am: Complete  During this time the patient tried to push and ultimately an anterior lip was found to be present so this was discontinued.   Exams by Dr. Janus Molder 1015: 8 1340: 8.5  Reviewed exam history with Dr. Janus Molder and established plan of care. If no cervical change at 4pm, we will recommend c-section given arrest of dilation for at least 6 hours by the same examiner although likely for longer.   Will discuss this option and recommendation at that time with the patient.   Radene Gunning, MD Attending [place], [facility] for [facility], [place]

## 2022-08-10 NOTE — Progress Notes (Signed)
Patient ID: Andrea Vaughn, female   DOB: 09-08-2001, 21 y.o.   MRN: [id number] Andrea Vaughn is a 21 y.o. G2P0010 at [redacted]w[redacted]d admitted for induction of labor due to enlarged fetal cisterna magna  Subjective: no complaints  Objective: BP 118/81   Pulse 84   Temp 98.4 F (36.9 C) (Oral)   Resp 16   Ht 5\' 4"  (1.626 m)   Wt 73.8 kg   LMP 11/08/2021   SpO2 99%   BMI 27.91 kg/m  No intake/output data recorded.  FHR baseline 135 bpm, Variability: moderate, Accelerations:present, Decelerations:  Absent Toco: q 2-4 mins   SVE:   Dilation: Lip/rim Effacement (%): 90 Station: Plus 1 Exam by:: Alexis Martinique RN  Pit off  Labs: Lab Results  Component Value Date   WBC 5.1 08/09/2022   HGB 12.4 08/09/2022   HCT 37.0 08/09/2022   MCV 90.7 08/09/2022   PLT 187 08/09/2022    Assessment / Plan: IOL d/t enlarged fetal cisterna magna. Previously called 10/100/+2 by RN @ 0230, trialed pushing for ~29mins but was having lates and decreased variability. Pit turned off, repositioned, allowed to labor down. Resumed pushing @ 0511, then rechecked by RN @ [phone] and feels firm anterior lip. Will stop pushing, restart pit.   Labor: active Fetal Wellbeing:  Category I Pain Control:  epidural Pre-eclampsia: N/A I/D:  GBS neg Anticipated MOD: NSVB  Roma Schanz CNM, WHNP-BC 08/10/2022, 6:16 AM

## 2022-08-10 NOTE — Progress Notes (Signed)
IUPC placed, loss of contractions on monitor. Cervical exam unchanged. Temp now 100.8. Start cefazolin 2g q1h and gent per pharm w/ tylenol.   Gerlene Fee, DO OB Fellow, [place] for [place] 08/10/2022, 6:49 PM

## 2022-08-10 NOTE — Progress Notes (Signed)
Labor Progress Note Andrea Vaughn is a 21 y.o. G2P0010 at [redacted]w[redacted]d presented for IOL.  S: Feeling tired.   O:  BP 113/69   Pulse 78   Temp 98.2 F (36.8 C) (Oral)   Resp 18   Ht 5\' 4"  (1.626 m)   Wt 73.8 kg   LMP 11/08/2021   SpO2 99%   BMI 27.91 kg/m  EFM: 135 bpm/moderate variability/+accels, prolonged decel  CVE: Dilation: 8 Dilation Complete Date: 08/10/22 Dilation Complete Time: 0850 Effacement (%): 90 Cervical Position: Posterior Station: 0 Presentation: Vertex Exam by:: Dr. Janus Molder   A&P: 21 y.o. [id number] [redacted]w[redacted]d here for IOL.  #Labor: Previously thought to be complete. Now 8 cm. On 2 of pit 2/2 low tolerance of labor. Prolonged decel w/ baseline change to 90-120 bpm. Improved to 135 with positioning.  #Pain: Epidural #FWB: Cat II improved to I with measures discussed above.  #GBS negative   Andrea Daus Autry-Lott, DO 10:43 AM

## 2022-08-10 NOTE — Progress Notes (Signed)
Andrea Vaughn is a 21 y.o. G2P0010 at [redacted]w[redacted]d admitted for induction of labor due to enlarged cisterna magna  Subjective: no complaints and comfortable with epidural  Objective: BP 121/86   Pulse 99   Temp 98.5 F (36.9 C) (Oral)   Resp 16   Ht 5\' 4"  (1.626 m)   Wt 73.8 kg   LMP 11/08/2021   SpO2 99%   BMI 27.91 kg/m  No intake/output data recorded.  FHR baseline 140 bpm, Variability: moderate, Accelerations:present, Decelerations:  Present  prolonged x 1 Toco: q 1-3 mins   SVE:   9/90/0 to plus 1 Pitocin @ 5 mu/min  Labs: Lab Results  Component Value Date   WBC 5.1 08/09/2022   HGB 12.4 08/09/2022   HCT 37.0 08/09/2022   MCV 90.7 08/09/2022   PLT 187 08/09/2022    Assessment / Plan: IOL d/t enlarged fetal cisterna magna, prolonged x 66mins, pit decreased in 1/2 to 77mu/min, progressing normally, now 9cm   Labor: active Fetal Wellbeing:  Category II Pain Control:  epidural Pre-eclampsia: N/A I/D:  GBS neg Anticipated MOD: NSVB  Roma Schanz CNM, WHNP-BC 08/10/2022, [phone]

## 2022-08-10 NOTE — Progress Notes (Signed)
Plan to check patient after I come from OR. If unchanged recommend proceeding with C/S. The patient is aware of this plan and we will discuss extensively/consent at that time.   Gerlene Fee, DO OB Fellow, [place] for [place] 08/10/2022, 6:15 PM

## 2022-08-10 NOTE — Progress Notes (Signed)
Labor Progress Note CATINA NUSS is a 21 y.o. G2P0010 at [redacted]w[redacted]d presented for IOL.  S: Feeling tired.   O:  BP 118/77   Pulse 93   Temp 98.2 F (36.8 C) (Oral)   Resp 18   Ht 5\' 4"  (1.626 m)   Wt 73.8 kg   LMP 11/08/2021   SpO2 99%   BMI 27.91 kg/m  EFM: 140 bpm/moderate variability/+accels, no decels  CVE: Dilation: 8.5 Dilation Complete Date: 08/10/22 Dilation Complete Time: 0850 Effacement (%): 100 Cervical Position: Posterior Station: 0 Presentation: Vertex Exam by:: Dr. Janus Molder   A&P: 21 y.o. [id number] [redacted]w[redacted]d here for IOL.  #Labor: Unchanged. Increase pitocin. If unchanged around 4 pm consider different mode of delivery.   #Pain: Epidural #FWB: Cat I  #GBS negative   Ford Peddie Autry-Lott, DO 2:04 PM

## 2022-08-11 ENCOUNTER — Encounter (HOSPITAL_COMMUNITY): Payer: Self-pay | Admitting: Obstetrics & Gynecology

## 2022-08-11 ENCOUNTER — Other Ambulatory Visit: Payer: Self-pay

## 2022-08-11 DIAGNOSIS — Z3A39 39 weeks gestation of pregnancy: Secondary | ICD-10-CM | POA: Diagnosis not present

## 2022-08-11 DIAGNOSIS — O9832 Other infections with a predominantly sexual mode of transmission complicating childbirth: Secondary | ICD-10-CM | POA: Diagnosis not present

## 2022-08-11 DIAGNOSIS — N179 Acute kidney failure, unspecified: Secondary | ICD-10-CM

## 2022-08-11 LAB — BASIC METABOLIC PANEL
Anion gap: 14 (ref 5–15)
BUN: 8 mg/dL (ref 6–20)
CO2: 20 mmol/L — ABNORMAL LOW (ref 22–32)
Calcium: 9 mg/dL (ref 8.9–10.3)
Chloride: 101 mmol/L (ref 98–111)
Creatinine, Ser: 2.11 mg/dL — ABNORMAL HIGH (ref 0.44–1.00)
GFR, Estimated: 34 mL/min — ABNORMAL LOW (ref >60)
Glucose, Bld: 127 mg/dL — ABNORMAL HIGH (ref 70–99)
Potassium: 4.1 mmol/L (ref 3.5–5.1)
Sodium: 135 mmol/L (ref 135–145)

## 2022-08-11 LAB — CBC
HCT: 36 % (ref 36.0–46.0)
Hemoglobin: 12.8 g/dL (ref 12.0–15.0)
MCH: 31.1 pg (ref 26.0–34.0)
MCHC: 35.6 g/dL (ref 30.0–36.0)
MCV: 87.6 fL (ref 80.0–100.0)
Platelets: 158 10*3/uL (ref 150–400)
RBC: 4.11 MIL/uL (ref 3.87–5.11)
RDW: 13 % (ref 11.5–15.5)
WBC: 18.4 10*3/uL — ABNORMAL HIGH (ref 4.0–10.5)
nRBC: 0 % (ref 0.0–0.2)

## 2022-08-11 MED ORDER — OXYCODONE HCL 5 MG PO TABS: 5.0000 mg | ORAL_TABLET | ORAL | Status: DC | PRN

## 2022-08-11 MED ORDER — MEDROXYPROGESTERONE ACETATE 150 MG/ML IM SUSP
150.0000 mg | INTRAMUSCULAR | Status: AC | PRN
  Administered 2022-08-13: 150 mg via INTRAMUSCULAR
  Filled 2022-08-11: qty 1

## 2022-08-11 MED ORDER — DIPHENHYDRAMINE HCL 25 MG PO CAPS: 25.0000 mg | ORAL_CAPSULE | Freq: Four times a day (QID) | ORAL | Status: DC | PRN

## 2022-08-11 MED ORDER — ACETAMINOPHEN 325 MG PO TABS: 650.0000 mg | ORAL_TABLET | ORAL | Status: DC | PRN

## 2022-08-11 MED ORDER — OXYTOCIN-SODIUM CHLORIDE 30-0.9 UT/500ML-% IV SOLN
INTRAVENOUS | Status: DC | PRN
  Administered 2022-08-11: 250 mL/h via INTRAVENOUS

## 2022-08-11 MED ORDER — KETOROLAC TROMETHAMINE 30 MG/ML IJ SOLN
30.0000 mg | Freq: Four times a day (QID) | INTRAMUSCULAR | Status: DC | PRN
  Administered 2022-08-11: 30 mg via INTRAVENOUS

## 2022-08-11 MED ORDER — OXYCODONE HCL 5 MG PO TABS: 5.0000 mg | ORAL_TABLET | Freq: Once | ORAL | Status: DC | PRN

## 2022-08-11 MED ORDER — KETOROLAC TROMETHAMINE 30 MG/ML IJ SOLN
30.0000 mg | Freq: Four times a day (QID) | INTRAMUSCULAR | Status: DC
  Administered 2022-08-11: 30 mg via INTRAVENOUS
  Filled 2022-08-11: qty 1

## 2022-08-11 MED ORDER — SIMETHICONE 80 MG PO CHEW: 80.0000 mg | CHEWABLE_TABLET | ORAL | Status: DC | PRN

## 2022-08-11 MED ORDER — MEPERIDINE HCL 25 MG/ML IJ SOLN: 6.2500 mg | INTRAMUSCULAR | Status: DC | PRN

## 2022-08-11 MED ORDER — TRANEXAMIC ACID-NACL 1000-0.7 MG/100ML-% IV SOLN
INTRAVENOUS | Status: AC
  Filled 2022-08-11: qty 100

## 2022-08-11 MED ORDER — COCONUT OIL OIL: 1.0000 | TOPICAL_OIL | Status: DC | PRN

## 2022-08-11 MED ORDER — SENNOSIDES-DOCUSATE SODIUM 8.6-50 MG PO TABS
2.0000 | ORAL_TABLET | Freq: Every day | ORAL | Status: DC
  Administered 2022-08-12 – 2022-08-13 (×2): 2 via ORAL
  Filled 2022-08-11 (×2): qty 2

## 2022-08-11 MED ORDER — MORPHINE SULFATE (PF) 0.5 MG/ML IJ SOLN
INTRAMUSCULAR | Status: DC | PRN
  Administered 2022-08-11: 3 mg via EPIDURAL

## 2022-08-11 MED ORDER — IBUPROFEN 600 MG PO TABS: 600.0000 mg | ORAL_TABLET | Freq: Four times a day (QID) | ORAL | Status: DC

## 2022-08-11 MED ORDER — PHENYLEPHRINE 80 MCG/ML (10ML) SYRINGE FOR IV PUSH (FOR BLOOD PRESSURE SUPPORT)
PREFILLED_SYRINGE | INTRAVENOUS | Status: DC | PRN
  Administered 2022-08-11: 80 ug via INTRAVENOUS

## 2022-08-11 MED ORDER — ACETAMINOPHEN 10 MG/ML IV SOLN
1000.0000 mg | Freq: Once | INTRAVENOUS | Status: DC | PRN
  Administered 2022-08-11: 1000 mg via INTRAVENOUS

## 2022-08-11 MED ORDER — OXYTOCIN-SODIUM CHLORIDE 30-0.9 UT/500ML-% IV SOLN
2.5000 [IU]/h | INTRAVENOUS | Status: AC
  Filled 2022-08-11: qty 500

## 2022-08-11 MED ORDER — KETOROLAC TROMETHAMINE 30 MG/ML IJ SOLN
INTRAMUSCULAR | Status: AC
  Filled 2022-08-11: qty 1

## 2022-08-11 MED ORDER — SODIUM CHLORIDE 0.9% FLUSH: 3.0000 mL | INTRAVENOUS | Status: DC | PRN

## 2022-08-11 MED ORDER — SIMETHICONE 80 MG PO CHEW
80.0000 mg | CHEWABLE_TABLET | Freq: Three times a day (TID) | ORAL | Status: DC
  Administered 2022-08-11 – 2022-08-13 (×7): 80 mg via ORAL
  Filled 2022-08-11 (×7): qty 1

## 2022-08-11 MED ORDER — NALOXONE HCL 0.4 MG/ML IJ SOLN: 0.4000 mg | INTRAMUSCULAR | Status: DC | PRN

## 2022-08-11 MED ORDER — MEASLES, MUMPS & RUBELLA VAC IJ SOLR: 0.5000 mL | Freq: Once | INTRAMUSCULAR | Status: DC

## 2022-08-11 MED ORDER — WITCH HAZEL-GLYCERIN EX PADS: 1.0000 | MEDICATED_PAD | CUTANEOUS | Status: DC | PRN

## 2022-08-11 MED ORDER — MENTHOL 3 MG MT LOZG: 1.0000 | LOZENGE | OROMUCOSAL | Status: DC | PRN

## 2022-08-11 MED ORDER — IBUPROFEN 600 MG PO TABS
600.0000 mg | ORAL_TABLET | Freq: Four times a day (QID) | ORAL | Status: DC
  Administered 2022-08-12 – 2022-08-13 (×7): 600 mg via ORAL
  Filled 2022-08-11 (×6): qty 1

## 2022-08-11 MED ORDER — NALOXONE HCL 4 MG/10ML IJ SOLN: 1.0000 ug/kg/h | INTRAVENOUS | Status: DC | PRN

## 2022-08-11 MED ORDER — TETANUS-DIPHTH-ACELL PERTUSSIS 5-2.5-18.5 LF-MCG/0.5 IM SUSY: 0.5000 mL | PREFILLED_SYRINGE | Freq: Once | INTRAMUSCULAR | Status: DC

## 2022-08-11 MED ORDER — KETOROLAC TROMETHAMINE 30 MG/ML IJ SOLN: 30.0000 mg | Freq: Four times a day (QID) | INTRAMUSCULAR | Status: DC | PRN

## 2022-08-11 MED ORDER — DIPHENHYDRAMINE HCL 50 MG/ML IJ SOLN: 12.5000 mg | INTRAMUSCULAR | Status: DC | PRN

## 2022-08-11 MED ORDER — ACETAMINOPHEN 10 MG/ML IV SOLN
INTRAVENOUS | Status: AC
  Filled 2022-08-11: qty 100

## 2022-08-11 MED ORDER — PROMETHAZINE HCL 25 MG/ML IJ SOLN: 6.2500 mg | INTRAMUSCULAR | Status: DC | PRN

## 2022-08-11 MED ORDER — ONDANSETRON HCL 4 MG/2ML IJ SOLN: 4.0000 mg | Freq: Three times a day (TID) | INTRAMUSCULAR | Status: DC | PRN

## 2022-08-11 MED ORDER — ENOXAPARIN SODIUM 40 MG/0.4ML IJ SOSY
40.0000 mg | PREFILLED_SYRINGE | INTRAMUSCULAR | Status: DC
  Administered 2022-08-11 – 2022-08-12 (×2): 40 mg via SUBCUTANEOUS
  Filled 2022-08-11 (×3): qty 0.4

## 2022-08-11 MED ORDER — DIPHENHYDRAMINE HCL 25 MG PO CAPS: 25.0000 mg | ORAL_CAPSULE | ORAL | Status: DC | PRN

## 2022-08-11 MED ORDER — SCOPOLAMINE 1 MG/3DAYS TD PT72
1.0000 | MEDICATED_PATCH | Freq: Once | TRANSDERMAL | Status: DC
  Administered 2022-08-11: 1.5 mg via TRANSDERMAL
  Filled 2022-08-11: qty 1

## 2022-08-11 MED ORDER — LACTATED RINGERS IV BOLUS: 1000.0000 mL | Freq: Once | INTRAVENOUS | Status: DC

## 2022-08-11 MED ORDER — OXYCODONE HCL 5 MG/5ML PO SOLN: 5.0000 mg | Freq: Once | ORAL | Status: DC | PRN

## 2022-08-11 MED ORDER — OXYTOCIN-SODIUM CHLORIDE 30-0.9 UT/500ML-% IV SOLN
INTRAVENOUS | Status: AC
  Filled 2022-08-11: qty 500

## 2022-08-11 MED ORDER — DEXAMETHASONE SODIUM PHOSPHATE 10 MG/ML IJ SOLN
INTRAMUSCULAR | Status: DC | PRN
  Administered 2022-08-11: 8 mg via INTRAVENOUS

## 2022-08-11 MED ORDER — DIBUCAINE (PERIANAL) 1 % EX OINT: 1.0000 | TOPICAL_OINTMENT | CUTANEOUS | Status: DC | PRN

## 2022-08-11 MED ORDER — HYDROMORPHONE HCL 1 MG/ML IJ SOLN: 0.2500 mg | INTRAMUSCULAR | Status: DC | PRN

## 2022-08-11 MED ORDER — LACTATED RINGERS IV SOLN: INTRAVENOUS | Status: DC

## 2022-08-11 MED ORDER — PRENATAL MULTIVITAMIN CH
1.0000 | ORAL_TABLET | Freq: Every day | ORAL | Status: DC
  Administered 2022-08-11 – 2022-08-13 (×3): 1 via ORAL
  Filled 2022-08-11 (×3): qty 1

## 2022-08-11 NOTE — Progress Notes (Signed)
POSTPARTUM PROGRESS NOTE  POD #0  Subjective:  Andrea Vaughn is a 21 y.o. G2P1011 s/p pLTCS at [redacted]w[redacted]d.  She reports she doing well. No acute events overnight. She reports she is doing well. She denies any problems with ambulating, voiding or po intake. Pain is well controlled.   Objective: Blood pressure 114/81, pulse 73, temperature 98.7 F (37.1 C), temperature source Oral, resp. rate 18, height 5\' 4"  (1.626 m), weight 73.8 kg, last menstrual period 11/08/2021, SpO2 98 %, unknown if currently breastfeeding.  Physical Exam:  General: alert, cooperative and no distress Chest: no respiratory distress Heart: regular rate noted  Recent Labs    08/09/22 0257 08/11/22 0506  HGB 12.4 12.8  HCT 37.0 36.0    Assessment/Plan: Andrea Vaughn is a 21 y.o. G2P1011 s/p pLTCS at [redacted]w[redacted]d for arrest of dilatation.  POD#0 - Doing welll; pain well controlled. H/H appropriate  Routine postpartum care  OOB, ambulated  Lovenox for VTE prophylaxis  AKI Cr 2.11. Low UOP, none documented since delivery at this time. Give 1L bolus at this time; encouraged PO fluids.  -Recheck BMP in the morning    LOS: 2 days   Gerlene Fee, DO 08/11/2022, 10:27 AM PGY-3, [place]

## 2022-08-11 NOTE — Lactation Note (Signed)
This note was copied from a baby's chart. Lactation Consultation Note  Patient Name: Andrea Vaughn [id number] Date: 08/11/2022   Age:21 hours  LC in to visit with P31 Mom of term baby.   Mom declined lactation support as she has decided to formula feed.  Mom owns a Medline pump that was a gift.  Offered to set up the [employer] but Mom declined saying she does not wish to pump either.  Mom to ask for help prn       Broadus John 08/11/2022, 12:09 PM

## 2022-08-11 NOTE — Op Note (Addendum)
[facility] PROCEDURE DATE: 08/11/2022  PREOPERATIVE DIAGNOSES: Intrauterine pregnancy at [redacted]w[redacted]d weeks gestation; failure to progress: arrest of dilation  POSTOPERATIVE DIAGNOSES: The same, viable infant delivered  PROCEDURE: PrimaryLow Transverse Cesarean Section  SURGEON:  Dr. Janyth Pupa  ASSISTANT:  Shelda Pal, DO An experienced assistant was required given the standard of surgical care given the complexity of the case.  This assistant was needed for exposure, dissection, suctioning, retraction, instrument exchange, assisting with delivery with administration of fundal pressure, and for overall help during the procedure.  ANESTHESIOLOGY TEAM: Anesthesiologist: Elzie Rings, MD; Lidia Collum, MD  INDICATIONS: KYREN VAUX is a 21 y.o. [phone] at [redacted]w[redacted]d here for cesarean section secondary to the indications listed under preoperative diagnoses; please see preoperative note for further details.  The risks of surgery were discussed with the patient including but were not limited to: bleeding which may require transfusion or reoperation; infection which may require antibiotics; injury to bowel, bladder, ureters or other surrounding organs; injury to the fetus; need for additional procedures including hysterectomy in the event of a life-threatening hemorrhage; formation of adhesions; placental abnormalities wth subsequent pregnancies; incisional problems; thromboembolic phenomenon and other postoperative/anesthesia complications.  The patient concurred with the proposed plan, giving informed written consent for the procedure.    FINDINGS:  Viable female infant in cephalic presentation.  Apgars 5 and 9.  Amniotic fluid: clear.  Intact placenta, three vessel cord.  Normal uterus, fallopian tubes and ovaries bilaterally.  ANESTHESIA: epidural INTRAVENOUS FLUIDS: 1000 ml   ESTIMATED BLOOD LOSS: 381 ml URINE OUTPUT:  75 ml SPECIMENS: Placenta  sent to pathology . COMPLICATIONS: None immediate  PROCEDURE IN DETAIL:  The patient preoperatively received intravenous antibiotics and had sequential compression devices applied to her lower extremities.  She was then taken to the operating room where the epidural was dosed up to a surgical level and found to be adequate. She was then placed in a dorsal supine position with a leftward tilt, and prepped and draped in a sterile manner.  A foley catheter was  was already in place.  After an adequate timeout was performed, a Pfannenstiel skin incision was made with scalpel and carried through to the underlying layer of fascia. The fascia was incised in the midline, and this incision was extended sharply with mayo scissors. The rectus muscles were separated in the midline and the peritoneum was entered bluntly.   The Alexis self-retaining retractor was introduced into the abdominal cavity.  Attention was turned to the lower uterine segment where a low transverse hysterotomy was made with a scalpel and extended bluntly in caudad and cephalad directions.  The infant was successfully delivered, the cord was clamped and cut immediately, and the infant was handed over to the awaiting neonatology team. Uterine massage was then administered, and the placenta delivered intact with a three-vessel cord. The uterus was then cleared of clots and debris.  The hysterotomy was closed with 0-Vicryl in a running fashion.  A second imbricating layer was placed with 0- Vicryl in running fashion. Figure-of-eight 0 Vicryl serosal stitches were placed to help with hemostasis.    The pelvis was cleared of all clot and debris. Hemostasis was confirmed on all surfaces. The uterus was once again inspected and found to be hemostatic. The retractor was removed.  The peritoneum was closed with a 2-0 Vicryl running stitch. The fascia was then closed using 0 Vicryl in a running fashion.  The subcutaneous layer was irrigated, any  areas of  bleeding were cauterized with the bovie, and was found to be hemostatic. The skin was closed with a 4-0 Vicryl subcuticular stitch. The patient tolerated the procedure well. Sponge, instrument and needle counts were correct x 3.  She was taken to the recovery room in stable condition.   Myrtie Hawk, DO FMOB Fellow, Faculty practice [facility], Center for [facility] 08/11/22  12:38 AM

## 2022-08-11 NOTE — Transfer of Care (Signed)
Immediate Anesthesia Transfer of Care Note  Patient: Andrea Vaughn  Procedure(s) Performed: CESAREAN SECTION  Patient Location: PACU  Anesthesia Type:Regional and Epidural  Level of Consciousness: awake, alert , oriented and patient cooperative  Airway & Oxygen Therapy: Patient Spontanous Breathing  Post-op Assessment: Report given to RN and Post -op Vital signs reviewed and stable  Post vital signs: Reviewed and stable  Last Vitals:  Vitals Value Taken Time  BP 117/84 08/11/22 0051  Temp    Pulse 90 08/11/22 0057  Resp 16 08/11/22 0057  SpO2 100 % 08/11/22 0057  Vitals shown include unvalidated device data.  Last Pain:  Vitals:   08/10/22 2141  TempSrc: Oral  PainSc:          Complications: No notable events documented.

## 2022-08-11 NOTE — Anesthesia Postprocedure Evaluation (Signed)
Anesthesia Post Note  Patient: Andrea Vaughn  Procedure(s) Performed: [place]     Patient location during evaluation: Mother Baby Anesthesia Type: Epidural Level of consciousness: awake and alert Pain management: pain level controlled Vital Signs Assessment: post-procedure vital signs reviewed and stable Respiratory status: spontaneous breathing, nonlabored ventilation and respiratory function stable Cardiovascular status: stable Postop Assessment: no headache, no backache and epidural receding Anesthetic complications: no   No notable events documented.  Last Vitals:  Vitals:   08/11/22 0400 08/11/22 0500  BP: 111/78 114/81  Pulse: 82 73  Resp: 18 18  Temp: 36.7 C 37.1 C  SpO2: 99% 98%    Last Pain:  Vitals:   08/11/22 0500  TempSrc: Oral  PainSc:    Pain Goal:                   Marlyn Rabine

## 2022-08-12 ENCOUNTER — Encounter (HOSPITAL_BASED_OUTPATIENT_CLINIC_OR_DEPARTMENT_OTHER): Payer: Medicaid Other | Admitting: Medical

## 2022-08-12 LAB — BASIC METABOLIC PANEL
Anion gap: 5 (ref 5–15)
BUN: 11 mg/dL (ref 6–20)
CO2: 21 mmol/L — ABNORMAL LOW (ref 22–32)
Calcium: 8.6 mg/dL — ABNORMAL LOW (ref 8.9–10.3)
Chloride: 111 mmol/L (ref 98–111)
Creatinine, Ser: 1.14 mg/dL — ABNORMAL HIGH (ref 0.44–1.00)
GFR, Estimated: >60 mL/min (ref >60)
Glucose, Bld: 79 mg/dL (ref 70–99)
Potassium: 3.9 mmol/L (ref 3.5–5.1)
Sodium: 137 mmol/L (ref 135–145)

## 2022-08-12 NOTE — Progress Notes (Signed)
POSTPARTUM PROGRESS NOTE  POD #1  Subjective:  STARLYN DROGE is a 21 y.o. G2P1011 s/p pLTCS at [redacted]w[redacted]d. No acute events overnight. She reports she is doing well. She denies any problems with ambulating, voiding or po intake. Denies nausea or vomiting. She has passed flatus. Pain is well controlled.  Lochia is adeqaute.  Objective: Blood pressure (!) 97/57, pulse 68, temperature 98 F (36.7 C), temperature source Oral, resp. rate 18, height 5\' 4"  (1.626 m), weight 73.8 kg, last menstrual period 11/08/2021, SpO2 100 %, unknown if currently breastfeeding.  Physical Exam:  General: alert, cooperative and no distress Chest: no respiratory distress Heart: regular rate, distal pulses intact Uterine Fundus: firm, appropriately tender DVT Evaluation: No calf swelling or tenderness Extremities: mild edema Skin: warm, dry; incision clean/dry/intact w/ honeycomb dressing in place  Recent Labs    08/11/22 0506  HGB 12.8  HCT 36.0   Lab Results  Component Value Date   CREATININE 1.14 (H) 08/12/2022   CREATININE 2.11 (H) 08/11/2022   CREATININE 1.52 (H) 08/10/2022     Assessment/Plan: DURENE DODGE is a 21 y.o. G2P1011 s/p pLTCS at [redacted]w[redacted]d for AOD.  POD#1 - Doing welll; pain well controlled. H/H appropriate  Routine postpartum care  OOB, ambulate  Lovenox for VTE prophylaxis  AKI- Cr improved, UOP adequate. Continue fluid PO intake. Contraception: Depo Feeding: breast and bottle  Dispo: Plan for discharge in the next 24-48h.   LOS: 3 days   Shelda Pal, DO OB Fellow  08/12/2022, 5:53 AM

## 2022-08-13 MED ORDER — SENNOSIDES-DOCUSATE SODIUM 8.6-50 MG PO TABS: 2.0000 | ORAL_TABLET | Freq: Every day | ORAL | 0 refills | Status: DC

## 2022-08-13 MED ORDER — ACETAMINOPHEN 325 MG PO TABS: 650.0000 mg | ORAL_TABLET | ORAL | 0 refills | Status: DC | PRN

## 2022-08-13 MED ORDER — IBUPROFEN 600 MG PO TABS: 600.0000 mg | ORAL_TABLET | Freq: Four times a day (QID) | ORAL | 0 refills | Status: DC

## 2022-08-13 MED ORDER — OXYCODONE HCL 5 MG PO TABS: 5.0000 mg | ORAL_TABLET | ORAL | 0 refills | Status: DC | PRN

## 2022-08-15 ENCOUNTER — Encounter (HOSPITAL_BASED_OUTPATIENT_CLINIC_OR_DEPARTMENT_OTHER): Payer: Self-pay | Admitting: *Deleted

## 2022-08-16 ENCOUNTER — Ambulatory Visit (INDEPENDENT_AMBULATORY_CARE_PROVIDER_SITE_OTHER): Payer: Medicaid Other | Admitting: *Deleted

## 2022-08-16 VITALS — BP 127/82 | HR 74 | Ht 64.0 in | Wt 148.0 lb

## 2022-08-16 DIAGNOSIS — Z98891 History of uterine scar from previous surgery: Secondary | ICD-10-CM

## 2022-08-16 DIAGNOSIS — Z9889 Other specified postprocedural states: Secondary | ICD-10-CM

## 2022-08-17 NOTE — Progress Notes (Signed)
Pt here for incision check, s/p cesarean section. Honeycomb dressing in place with quarter size amount of old drainage. Dressing removed. Incision approximated with no new drainage, no bleeding, no redness or signs of infection. Advised pt to notify office if incision separates, starts to have drainage or becomes red or hot to touch. Pt will return for scheduled postpartum appt.

## 2022-08-19 ENCOUNTER — Encounter (HOSPITAL_BASED_OUTPATIENT_CLINIC_OR_DEPARTMENT_OTHER): Payer: Medicaid Other | Admitting: Obstetrics & Gynecology

## 2022-09-20 ENCOUNTER — Ambulatory Visit (HOSPITAL_BASED_OUTPATIENT_CLINIC_OR_DEPARTMENT_OTHER): Payer: Self-pay | Admitting: Obstetrics & Gynecology

## 2022-10-04 ENCOUNTER — Ambulatory Visit (INDEPENDENT_AMBULATORY_CARE_PROVIDER_SITE_OTHER): Payer: Medicaid Other | Admitting: Medical

## 2022-10-04 ENCOUNTER — Other Ambulatory Visit (HOSPITAL_COMMUNITY)
Admission: RE | Admit: 2022-10-04 | Discharge: 2022-10-04 | Disposition: A | Discharge status: Home / Self Care | Payer: Medicaid Other | Source: Ambulatory Visit | Attending: Medical | Admitting: Medical

## 2022-10-04 ENCOUNTER — Encounter (HOSPITAL_BASED_OUTPATIENT_CLINIC_OR_DEPARTMENT_OTHER): Payer: Self-pay | Admitting: Medical

## 2022-10-04 DIAGNOSIS — Z113 Encounter for screening for infections with a predominantly sexual mode of transmission: Secondary | ICD-10-CM | POA: Diagnosis not present

## 2022-10-04 DIAGNOSIS — Z124 Encounter for screening for malignant neoplasm of cervix: Secondary | ICD-10-CM

## 2022-10-04 DIAGNOSIS — Z98891 History of uterine scar from previous surgery: Secondary | ICD-10-CM

## 2022-10-04 DIAGNOSIS — Z3042 Encounter for surveillance of injectable contraceptive: Secondary | ICD-10-CM | POA: Diagnosis not present

## 2022-10-04 NOTE — Progress Notes (Signed)
Post Partum Visit Note  Andrea Vaughn is a 21 y.o. G23P1011 female who presents for a postpartum visit. She is 6 weeks postpartum following a primary cesarean section due to failure to progress  I have fully reviewed the prenatal and intrapartum course. The delivery was at 39.3 gestational weeks.  Anesthesia: epidural. Postpartum course has been uncomplicated. Baby is doing well. Baby is feeding by bottle - Enfamil Gentle . Bleeding no bleeding. Bowel function is normal. Bladder function is normal. Patient is sexually active. Contraception method is Depo-Provera injections. Postpartum depression screening: negative.   Upstream - 10/04/22 1141       Pregnancy Intention Screening   Does the patient want to become pregnant in the next year? No    Does the patient's partner want to become pregnant in the next year? No    Would the patient like to discuss contraceptive options today? N/A      Contraception Wrap Up   Current Method Hormonal Injection    End Method Hormonal Injection    Contraception Counseling Provided No    How was the end contraceptive method provided? N/A            The pregnancy intention screening data noted above was reviewed. Potential methods of contraception were discussed. The patient elected to proceed with Hormonal Injection.   Edinburgh Postnatal Depression Scale - 10/04/22 1134       Edinburgh Postnatal Depression Scale:  In the Past 7 Days   I have been able to laugh and see the funny side of things. 0    I have looked forward with enjoyment to things. 0    I have blamed myself unnecessarily when things went wrong. 1    I have been anxious or worried for no good reason. 0    I have felt scared or panicky for no good reason. 3    Things have been getting on top of me. 1    I have been so unhappy that I have had difficulty sleeping. 0    I have felt sad or miserable. 0    I have been so unhappy that I have been crying. 1    The thought of harming  myself has occurred to me. 0    Edinburgh Postnatal Depression Scale Total 6             Health Maintenance Due  Topic Date Due   INFLUENZA VACCINE  Never done   PAP-Cervical Cytology Screening  08/28/2022   PAP SMEAR-Modifier  08/28/2022    The following portions of the patient's history were reviewed and updated as appropriate: allergies, current medications, past family history, past medical history, past social history, past surgical history, and problem list.  Review of Systems Pertinent items are noted in HPI.  Objective:  BP 126/74   Pulse 72   Ht 5\' 4"  (1.626 m)   Wt 143 lb (64.9 kg)   LMP 11/08/2021   Breastfeeding No   BMI 24.55 kg/m    General:  alert and cooperative   Breasts:  not indicated  Lungs: clear to auscultation bilaterally  Heart:  regular rate and rhythm, S1, S2 normal, no murmur, click, rub or gallop  Abdomen: Soft, non-tender    Wound well approximated incision  GU exam:  normal        Assessment:   1. Routine postpartum follow-up  2. Status post C-section - Healing well, continue to limit weight  3. Cervical cancer screening -  Pap smear today   4. Depo-Provera contraceptive status - Had first injection 9/30 @ [facility], will schedule to return in ~ 6 weeks  5. STD Screening - At patients request, HIV, RPR, Hep B, Hep C added - GC/CT and trichomonas off of pap smear   Normal postpartum exam.   Plan:   Essential components of care per ACOG recommendations:  1.  Mood and well being: Patient with negative depression screening today. Reviewed local resources for support.  - Patient tobacco use? No.   - hx of drug use? No.    2. Infant care and feeding:  -Patient currently breastmilk feeding? No.  -Social determinants of health (SDOH) reviewed in EPIC. No concerns  3. Sexuality, contraception and birth spacing - Patient does not want a pregnancy in the next year.  Desired family size is 2 children.  - Reviewed reproductive life  planning. Reviewed contraceptive methods based on pt preferences and effectiveness.  Patient had Hormonal Injection on 08/13/22, prior to leaving [facility].   - Discussed birth spacing of 18 months  4. Sleep and fatigue -Encouraged family/partner/community support of 4 hrs of uninterrupted sleep to help with mood and fatigue  5. Physical Recovery  - Discussed patients delivery and complications. She describes her labor as mixed. - Patient had a C-section failure to progress.  - Patient has urinary incontinence? No. - Patient is safe to resume physical and sexual activity  6.  Health Maintenance - HM due items addressed Yes - Last pap smear No results found for: "DIAGPAP" Pap smear done at today's visit. Patient also requested STD testing which was added to pap smear and lab request.  -Breast Cancer screening indicated? No.   7. Chronic Disease/Pregnancy Condition follow up: None - PCP follow up, established at [facility]   Vonzella Nipple, PA-C [facility] [employer], [facility]

## 2022-10-05 LAB — HIV ANTIBODY (ROUTINE TESTING W REFLEX): HIV Screen 4th Generation wRfx: NONREACTIVE

## 2022-10-05 LAB — RPR: RPR Ser Ql: NONREACTIVE

## 2022-10-05 LAB — CYTOLOGY - PAP
Chlamydia: NEGATIVE
Comment: NEGATIVE
Comment: NEGATIVE
Comment: NORMAL
Diagnosis: NEGATIVE
Neisseria Gonorrhea: NEGATIVE
Trichomonas: NEGATIVE

## 2022-10-05 LAB — HEPATITIS B SURFACE ANTIGEN: Hepatitis B Surface Ag: NEGATIVE

## 2022-10-05 LAB — HEPATITIS C ANTIBODY: Hep C Virus Ab: NONREACTIVE

## 2022-10-31 ENCOUNTER — Ambulatory Visit (HOSPITAL_BASED_OUTPATIENT_CLINIC_OR_DEPARTMENT_OTHER): Payer: Medicaid Other

## 2023-07-22 ENCOUNTER — Encounter (HOSPITAL_COMMUNITY): Payer: Self-pay | Admitting: Emergency Medicine

## 2023-07-22 ENCOUNTER — Emergency Department (EMERGENCY_DEPARTMENT_HOSPITAL): Payer: Medicaid Other | Admitting: Certified Registered"

## 2023-07-22 ENCOUNTER — Emergency Department (HOSPITAL_COMMUNITY): Payer: Medicaid Other

## 2023-07-22 ENCOUNTER — Emergency Department (HOSPITAL_COMMUNITY): Payer: Medicaid Other | Admitting: Certified Registered"

## 2023-07-22 ENCOUNTER — Other Ambulatory Visit: Payer: Self-pay

## 2023-07-22 ENCOUNTER — Ambulatory Visit (HOSPITAL_COMMUNITY)
Admission: EM | Admit: 2023-07-22 | Discharge: 2023-07-22 | Disposition: A | Discharge status: Home / Self Care | Payer: Medicaid Other | Attending: Emergency Medicine | Admitting: Emergency Medicine

## 2023-07-22 ENCOUNTER — Encounter (HOSPITAL_COMMUNITY)
Admission: EM | Disposition: A | Discharge status: Home / Self Care | Payer: Self-pay | Source: Home / Self Care | Attending: Emergency Medicine

## 2023-07-22 DIAGNOSIS — L089 Local infection of the skin and subcutaneous tissue, unspecified: Secondary | ICD-10-CM

## 2023-07-22 DIAGNOSIS — M009 Pyogenic arthritis, unspecified: Secondary | ICD-10-CM | POA: Insufficient documentation

## 2023-07-22 DIAGNOSIS — W503XXA Accidental bite by another person, initial encounter: Secondary | ICD-10-CM

## 2023-07-22 DIAGNOSIS — S66821A Laceration of other specified muscles, fascia and tendons at wrist and hand level, right hand, initial encounter: Secondary | ICD-10-CM | POA: Diagnosis not present

## 2023-07-22 DIAGNOSIS — L03011 Cellulitis of right finger: Secondary | ICD-10-CM | POA: Diagnosis not present

## 2023-07-22 DIAGNOSIS — Z23 Encounter for immunization: Secondary | ICD-10-CM | POA: Insufficient documentation

## 2023-07-22 HISTORY — PX: I & D EXTREMITY: SHX5045

## 2023-07-22 LAB — CBC WITH DIFFERENTIAL/PLATELET
Abs Immature Granulocytes: 0 10*3/uL (ref 0.00–0.07)
Basophils Absolute: 0 10*3/uL (ref 0.0–0.1)
Basophils Relative: 1 %
Eosinophils Absolute: 0 10*3/uL (ref 0.0–0.5)
Eosinophils Relative: 0 %
HCT: 39.9 % (ref 36.0–46.0)
Hemoglobin: 13.2 g/dL (ref 12.0–15.0)
Immature Granulocytes: 0 %
Lymphocytes Relative: 36 %
Lymphs Abs: 2.1 10*3/uL (ref 0.7–4.0)
MCH: 29.1 pg (ref 26.0–34.0)
MCHC: 33.1 g/dL (ref 30.0–36.0)
MCV: 87.9 fL (ref 80.0–100.0)
Monocytes Absolute: 0.5 10*3/uL (ref 0.1–1.0)
Monocytes Relative: 9 %
Neutro Abs: 3.1 10*3/uL (ref 1.7–7.7)
Neutrophils Relative %: 54 %
Platelets: 217 10*3/uL (ref 150–400)
RBC: 4.54 MIL/uL (ref 3.87–5.11)
RDW: 13.8 % (ref 11.5–15.5)
WBC: 5.7 10*3/uL (ref 4.0–10.5)
nRBC: 0 % (ref 0.0–0.2)

## 2023-07-22 LAB — BASIC METABOLIC PANEL
Anion gap: 7 (ref 5–15)
BUN: 11 mg/dL (ref 6–20)
CO2: 25 mmol/L (ref 22–32)
Calcium: 8.9 mg/dL (ref 8.9–10.3)
Chloride: 103 mmol/L (ref 98–111)
Creatinine, Ser: 0.58 mg/dL (ref 0.44–1.00)
GFR, Estimated: >60 mL/min (ref >60)
Glucose, Bld: 86 mg/dL (ref 70–99)
Potassium: 3.3 mmol/L — ABNORMAL LOW (ref 3.5–5.1)
Sodium: 135 mmol/L (ref 135–145)

## 2023-07-22 LAB — HCG, SERUM, QUALITATIVE: Preg, Serum: NEGATIVE

## 2023-07-22 SURGERY — IRRIGATION AND DEBRIDEMENT EXTREMITY
Anesthesia: General | Site: Hand | Laterality: Right

## 2023-07-22 MED ORDER — HYDROMORPHONE HCL 1 MG/ML IJ SOLN
INTRAMUSCULAR | Status: AC
  Filled 2023-07-22: qty 1

## 2023-07-22 MED ORDER — OXYCODONE HCL 5 MG PO TABS
5.0000 mg | ORAL_TABLET | Freq: Four times a day (QID) | ORAL | 0 refills | Status: DC | PRN
Start: 2023-07-22 — End: 2023-08-30

## 2023-07-22 MED ORDER — PROMETHAZINE HCL 25 MG/ML IJ SOLN: 6.2500 mg | INTRAMUSCULAR | Status: DC | PRN

## 2023-07-22 MED ORDER — MEPERIDINE HCL 50 MG/ML IJ SOLN: 6.2500 mg | INTRAMUSCULAR | Status: DC | PRN

## 2023-07-22 MED ORDER — OXYCODONE HCL 5 MG PO TABS
5.0000 mg | ORAL_TABLET | Freq: Once | ORAL | Status: AC | PRN
  Administered 2023-07-22: 5 mg via ORAL

## 2023-07-22 MED ORDER — CLINDAMYCIN PHOSPHATE 600 MG/50ML IV SOLN
600.0000 mg | Freq: Once | INTRAVENOUS | Status: AC
  Administered 2023-07-22: 600 mg via INTRAVENOUS
  Filled 2023-07-22: qty 50

## 2023-07-22 MED ORDER — MIDAZOLAM HCL 2 MG/2ML IJ SOLN
INTRAMUSCULAR | Status: AC
  Filled 2023-07-22: qty 2

## 2023-07-22 MED ORDER — LIDOCAINE HCL 1 % IJ SOLN
INTRAMUSCULAR | Status: AC
  Filled 2023-07-22: qty 20

## 2023-07-22 MED ORDER — DEXAMETHASONE SODIUM PHOSPHATE 10 MG/ML IJ SOLN
INTRAMUSCULAR | Status: DC | PRN
  Administered 2023-07-22: 10 mg via INTRAVENOUS

## 2023-07-22 MED ORDER — PROPOFOL 10 MG/ML IV BOLUS
INTRAVENOUS | Status: AC
  Filled 2023-07-22: qty 20

## 2023-07-22 MED ORDER — LACTATED RINGERS IV SOLN
INTRAVENOUS | Status: DC | PRN
Start: 2023-07-22 — End: 2023-07-22

## 2023-07-22 MED ORDER — TETANUS-DIPHTH-ACELL PERTUSSIS 5-2.5-18.5 LF-MCG/0.5 IM SUSY
0.5000 mL | PREFILLED_SYRINGE | Freq: Once | INTRAMUSCULAR | Status: AC
  Administered 2023-07-22: 0.5 mL via INTRAMUSCULAR
  Filled 2023-07-22: qty 0.5

## 2023-07-22 MED ORDER — BUPIVACAINE HCL (PF) 0.5 % IJ SOLN
INTRAMUSCULAR | Status: AC
  Filled 2023-07-22: qty 30

## 2023-07-22 MED ORDER — FENTANYL CITRATE (PF) 100 MCG/2ML IJ SOLN
INTRAMUSCULAR | Status: AC
  Filled 2023-07-22: qty 2

## 2023-07-22 MED ORDER — ONDANSETRON HCL 4 MG/2ML IJ SOLN
INTRAMUSCULAR | Status: DC | PRN
  Administered 2023-07-22: 4 mg via INTRAVENOUS

## 2023-07-22 MED ORDER — OXYCODONE HCL 5 MG/5ML PO SOLN: 5.0000 mg | Freq: Once | ORAL | Status: AC | PRN

## 2023-07-22 MED ORDER — PROPOFOL 10 MG/ML IV BOLUS
INTRAVENOUS | Status: DC | PRN
  Administered 2023-07-22: 200 mg via INTRAVENOUS

## 2023-07-22 MED ORDER — HYDROCODONE-ACETAMINOPHEN 5-325 MG PO TABS
2.0000 | ORAL_TABLET | Freq: Once | ORAL | Status: AC
  Administered 2023-07-22: 2 via ORAL
  Filled 2023-07-22: qty 2

## 2023-07-22 MED ORDER — KETOROLAC TROMETHAMINE 30 MG/ML IJ SOLN
INTRAMUSCULAR | Status: AC
  Filled 2023-07-22: qty 1

## 2023-07-22 MED ORDER — 0.9 % SODIUM CHLORIDE (POUR BTL) OPTIME
TOPICAL | Status: DC | PRN
  Administered 2023-07-22: 1000 mL

## 2023-07-22 MED ORDER — AMISULPRIDE (ANTIEMETIC) 5 MG/2ML IV SOLN: 10.0000 mg | Freq: Once | INTRAVENOUS | Status: DC | PRN

## 2023-07-22 MED ORDER — HYDROMORPHONE HCL 1 MG/ML IJ SOLN
0.2500 mg | INTRAMUSCULAR | Status: DC | PRN
  Administered 2023-07-22 (×2): 0.25 mg via INTRAVENOUS

## 2023-07-22 MED ORDER — ACETAMINOPHEN 10 MG/ML IV SOLN
INTRAVENOUS | Status: DC | PRN
Start: 2023-07-22 — End: 2023-07-22
  Administered 2023-07-22: 1000 mg via INTRAVENOUS

## 2023-07-22 MED ORDER — BACITRACIN ZINC 500 UNIT/GM EX OINT
TOPICAL_OINTMENT | CUTANEOUS | Status: AC
  Filled 2023-07-22: qty 28.35

## 2023-07-22 MED ORDER — METRONIDAZOLE 500 MG PO TABS
500.0000 mg | ORAL_TABLET | Freq: Three times a day (TID) | ORAL | 0 refills | Status: AC
Start: 2023-07-22 — End: 2023-08-05

## 2023-07-22 MED ORDER — OXYCODONE HCL 5 MG PO TABS
ORAL_TABLET | ORAL | Status: AC
  Filled 2023-07-22: qty 1

## 2023-07-22 MED ORDER — MIDAZOLAM HCL 5 MG/5ML IJ SOLN
INTRAMUSCULAR | Status: DC | PRN
  Administered 2023-07-22: 2 mg via INTRAVENOUS

## 2023-07-22 MED ORDER — SODIUM CHLORIDE 0.9 % IR SOLN
Status: DC | PRN
  Administered 2023-07-22: 3000 mL

## 2023-07-22 MED ORDER — DEXAMETHASONE SODIUM PHOSPHATE 10 MG/ML IJ SOLN
INTRAMUSCULAR | Status: AC
  Filled 2023-07-22: qty 1

## 2023-07-22 MED ORDER — KETOROLAC TROMETHAMINE 30 MG/ML IJ SOLN
INTRAMUSCULAR | Status: DC | PRN
Start: 2023-07-22 — End: 2023-07-22
  Administered 2023-07-22: 30 mg via INTRAVENOUS

## 2023-07-22 MED ORDER — DOXYCYCLINE HYCLATE 100 MG PO CAPS
100.0000 mg | ORAL_CAPSULE | Freq: Two times a day (BID) | ORAL | 0 refills | Status: DC
Start: 2023-07-22 — End: 2023-08-30

## 2023-07-22 MED ORDER — ACETAMINOPHEN 10 MG/ML IV SOLN
INTRAVENOUS | Status: AC
  Filled 2023-07-22: qty 100

## 2023-07-22 MED ORDER — LIDOCAINE 2% (20 MG/ML) 5 ML SYRINGE
INTRAMUSCULAR | Status: DC | PRN
  Administered 2023-07-22: 40 mg via INTRAVENOUS

## 2023-07-22 MED ORDER — KETOROLAC TROMETHAMINE 30 MG/ML IJ SOLN
INTRAMUSCULAR | Status: AC
  Filled 2023-07-22: qty 2

## 2023-07-22 MED ORDER — BACITRACIN ZINC 500 UNIT/GM EX OINT
TOPICAL_OINTMENT | CUTANEOUS | Status: DC | PRN
Start: 2023-07-22 — End: 2023-07-23
  Administered 2023-07-22: 1 via TOPICAL

## 2023-07-22 MED ORDER — ONDANSETRON HCL 4 MG/2ML IJ SOLN
INTRAMUSCULAR | Status: AC
  Filled 2023-07-22: qty 2

## 2023-07-22 MED ORDER — FENTANYL CITRATE (PF) 250 MCG/5ML IJ SOLN
INTRAMUSCULAR | Status: DC | PRN
  Administered 2023-07-22 (×2): 50 ug via INTRAVENOUS

## 2023-07-22 SURGICAL SUPPLY — 40 items
BNDG CMPR 5X4 KNIT ELC UNQ LF (GAUZE/BANDAGES/DRESSINGS) ×1
BNDG ELASTIC 4INX 5YD STR LF (GAUZE/BANDAGES/DRESSINGS) ×1 IMPLANT
CORD BIPOLAR FORCEPS 12FT (ELECTRODE) IMPLANT
COVER BACK TABLE 60X90IN (DRAPES) ×1 IMPLANT
CUFF TOURN SGL QUICK 18X4 (TOURNIQUET CUFF) IMPLANT
DRAPE EXTREMITY T 121X128X90 (DISPOSABLE) IMPLANT
DRAPE ORTHO SPLIT 77X108 STRL (DRAPES) ×1
DRAPE SHEET LG 3/4 BI-LAMINATE (DRAPES) ×1 IMPLANT
DRAPE SURG 17X23 STRL (DRAPES) ×1 IMPLANT
DRAPE SURG ORHT 6 SPLT 77X108 (DRAPES) ×1 IMPLANT
DRSG EMULSION OIL 3X3 NADH (GAUZE/BANDAGES/DRESSINGS) IMPLANT
ELECT REM PT RETURN 15FT ADLT (MISCELLANEOUS) IMPLANT
GAUZE 4X4 16PLY ~~LOC~~+RFID DBL (SPONGE) ×1 IMPLANT
GAUZE SPONGE 4X4 12PLY STRL (GAUZE/BANDAGES/DRESSINGS) ×1 IMPLANT
GAUZE XEROFORM 1X8 LF (GAUZE/BANDAGES/DRESSINGS) IMPLANT
GLOVE BIO SURGEON STRL SZ7.5 (GLOVE) ×2 IMPLANT
GLOVE BIOGEL PI IND STRL 7.5 (GLOVE) ×1 IMPLANT
GLOVE SURG ORTHO 8.0 STRL STRW (GLOVE) ×1 IMPLANT
GOWN STRL REUS W/ TWL LRG LVL3 (GOWN DISPOSABLE) ×1 IMPLANT
GOWN STRL REUS W/TWL LRG LVL3 (GOWN DISPOSABLE) ×1
HANDPIECE INTERPULSE COAX TIP (DISPOSABLE)
HIBICLENS CHG 4% 4OZ BTL (MISCELLANEOUS) ×1 IMPLANT
KIT BASIN OR (CUSTOM PROCEDURE TRAY) ×1 IMPLANT
KIT TURNOVER KIT A (KITS) ×1 IMPLANT
MANIFOLD NEPTUNE II (INSTRUMENTS) ×1 IMPLANT
NDL HYPO 25X1 1.5 SAFETY (NEEDLE) IMPLANT
NEEDLE HYPO 25X1 1.5 SAFETY (NEEDLE) IMPLANT
NS IRRIG 1000ML POUR BTL (IV SOLUTION) ×1 IMPLANT
PADDING CAST ABS COTTON 4X4 ST (CAST SUPPLIES) IMPLANT
SET HNDPC FAN SPRY TIP SCT (DISPOSABLE) IMPLANT
SPLINT CAST 1 STEP 4X15 (MISCELLANEOUS) IMPLANT
SPONGE T-LAP 4X18 ~~LOC~~+RFID (SPONGE) IMPLANT
SUT ETHILON 4 0 PS 2 18 (SUTURE) IMPLANT
SUT PDS AB 3-0 SH 27 (SUTURE) IMPLANT
SYR 10ML LL (SYRINGE) ×1 IMPLANT
SYR BULB EAR ULCER 3OZ GRN STR (SYRINGE) ×1 IMPLANT
TOWEL OR 17X26 10 PK STRL BLUE (TOWEL DISPOSABLE) ×1 IMPLANT
TUBING CONNECTING 10 (TUBING) ×1 IMPLANT
UNDERPAD 30X36 HEAVY ABSORB (UNDERPADS AND DIAPERS) ×1 IMPLANT
YANKAUER SUCT BULB TIP NO VENT (SUCTIONS) ×1 IMPLANT

## 2023-07-22 NOTE — Op Note (Signed)
OPERATIVE NOTE  DATE OF PROCEDURE: 07/22/2023  SURGEONS:  Primary: Gomez Cleverly, MD  ASSISTANT: Payton Mccallum, PA-C  Due to the complexity of the surgery an assistant was necessary to aid in retraction, exposure, limb positioning, closure and dressing application. The use of an assistant on this case follows CMS and CPT guidelines, which allows an assistant to be used because of the complexity level of this case.    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, right hand fourth and fifth extensor tendon lacerations  NAME OF PROCEDURE:   Right hand fourth metacarpophalangeal joint arthrotomy and irrigation and debridement of septic arthritis Right hand fifth metacarpophalangeal joint arthrotomy and irrigation and debridement of septic arthritis Right hand fourth extensor digitorum communis extensor tendon repair Right hand fifth extensor digitorum communis extensor tendon repair Right hand extensor digit minimi extensor tendon repair  ANESTHESIA: GETA  SKIN PREPARATION: Hibiclens  ESTIMATED BLOOD LOSS: Minimal  IMPLANTS: none  INDICATIONS:  Andrea Vaughn is a 22 y.o. female who has the above preoperative diagnosis. The patient has decided to proceed with surgical intervention.  Risks, benefits and alternatives of operative management were discussed including, but not limited to, risks of anesthesia complications, infection, pain, persistent symptoms, stiffness, need for future surgery.  The patient understands, agrees and elects to proceed with surgery.    DESCRIPTION OF PROCEDURE: The patient was met in the pre-operative area and their identity was verified.  The operative location and laterality was also verified and marked.  The patient was brought to the OR and was placed supine on the table.  After repeat patient identification with the operative team anesthesia was provided and the patient  was prepped and draped in the usual sterile fashion.  A final timeout was performed verifying the correction patient, procedure, location and laterality.  After general anesthesia the right upper extremity was prepped and draped in usual sterile fashion.  Preoperative antibiotics had been provided on the floor there is no additional antibiotics provided just prior to the incision and these were held prior to culture.  The fourth and fifth metacarpal phalangeal joints were identified and there was abrasions over both of these.  Longitudinal incisions were made and skin and subcutaneous tissues were divided and frank purulence was immediately identified.  Then began at the fourth MCP joint and there was a clear extensor tendon laceration and septic arthritis emanating from the fourth metacarpophalangeal joint and arthrotomy was performed and gross purulence was identified.  Deep cultures were obtained.  At this time dissection was carried down to the fifth MCP joint.  Arthrotomy was carried out and there was contamination of this joint as well with cloudy fluid within the joint.  At this time the fourth and fifth MCP joints and the wounds were thoroughly irrigated with low flow cystoscopy tubing for a total of 3 L of normal saline.  This was completely clean at the end of the irrigation and debridement and attention was turned to extensor tendon repair.  The 3 tendons were identified and were lacerated.  These tendons were repaired with 3-0 PDS suture with a combination of a 4 core suture technique and a figure-of-eight epitendinous repair.  The joint capsules were left open.  The wounds were again thoroughly irrigated and the skin was closed with absorbable sutures.  Careful hemostasis was obtained throughout.  All counts were correct x 2 at the end of the procedure.  A sterile soft bandage and a  MP flexion block splint was applied with PIP joints free.  The tourniquet was deflated and the fingers were pink and  warm and well-perfused with brisk cap of the refill.  All counts were correct x 2.  The patient was awoken from anesthesia and brought to PACU for recovery in stable condition.   Philipp Ovens, MD
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
## 2023-07-22 NOTE — Anesthesia Postprocedure Evaluation (Signed)
Anesthesia Post Note  Patient: Andrea Vaughn  Procedure(s) Performed: IRRIGATION AND DEBRIDEMENT RIGHT HAND (Right: Hand)     Patient location during evaluation: PACU Anesthesia Type: General Level of consciousness: awake and alert Pain management: pain level controlled Vital Signs Assessment: post-procedure vital signs reviewed and stable Respiratory status: spontaneous breathing, nonlabored ventilation and respiratory function stable Cardiovascular status: blood pressure returned to baseline and stable Postop Assessment: no apparent nausea or vomiting Anesthetic complications: no   No notable events documented.  Last Vitals:  Vitals:   07/22/23 1815 07/22/23 1826  BP: 109/74   Pulse: (!) 57 66  Resp: 11 16  Temp:    SpO2: 100% 100%    Last Pain:  Vitals:   07/22/23 1814  TempSrc:   PainSc: Asleep                 Lowella Curb

## 2023-07-22 NOTE — ED Notes (Signed)
OR called    asked to get patient in a gown and that they would be down here shortly to get her

## 2023-07-22 NOTE — Discharge Instructions (Addendum)
  Orthopaedic Hand Surgery Discharge Instructions  *Two antibiotics as well as pain medication were sent to [id number] - [place], [place] - [address]  [address], [place] [address]*  WEIGHT BEARING STATUS: Non weight bearing on operative extremity  DRESSING CARE: Please keep your dressing/splint/cast clean and dry until your follow-up appointment. You may shower by placing a waterproof covering over your dressing/splint/cast. Contact your surgeon if your splint/cast gets wet. It will need to be changed to prevent skin breakdown.  PAIN CONTROL: First line medications for post operative pain control are Tylenol (acetaminophen) and Motrin (ibuprofen) if you are able to take these medications. If you have been prescribed a medication these can be taken as breakthrough pain medications. Please note that some narcotic pain medication has acetaminophen added and you should never consume more than 4,000mg  of acetaminophen in 24-hour period. Please note that if you are given Toradol (ketorolac) you should not take similar medications such as ibuprofen or naproxen.  DISCHARGE MEDICATIONS: If you have been prescribed medication it was sent electronically to your pharmacy. No changes have been made to your home medications.  ICE/ELEVATION: Ice and elevate your injured extremity as needed. Avoid direct contact of ice with skin.   BANDAGE FEELS TOO TIGHT: If your bandage feels too tight, first make sure you are elevating your fingers as much as possible. The outer layer of the bandage can be unwrapped and reapplied more loosely. If no improvement, you may carefully cut the inner layer longitudinally until the pressure has resolved and then rewrap the outer layer. If you are not comfortable with these instructions, please call the office and the bandage can be changed for you.   FOLLOW UP: You will be called after surgery with an appointment  date and time, however if you have not received a phone call within 3 days, please call during regular office hours at [phone] to schedule a post operative appointment.  Please Seek Medical Attention if: Call MD for: pain or pressure in chest, jaw, arm, back, neck  Call MD for: temperature greater than 101 F for more than 24 hrs Call MD for: difficulty breathing Call MD for: incision redness, bleeding, drainage  Call MD for: palpitations or feeling that the heart is racing  Call MD for: increased swelling in arm, leg, ankle, or abdomen  Call MD for: lightheadedness, dizziness, fainting Call 911 or go to ER for any medical emergency if you are not able to get in touch with your doctor   J. Standley Dakins, MD Orthopaedic Hand Surgeon [facility] Office number: [phone] [address] [place], [place] [address]
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
## 2023-07-22 NOTE — Anesthesia Procedure Notes (Signed)
Procedure Name: LMA Insertion Date/Time: 07/22/2023 5:15 PM  Performed by: Ponciano Ort, CRNAPre-anesthesia Checklist: Patient identified, Emergency Drugs available, Suction available and Patient being monitored Patient Re-evaluated:Patient Re-evaluated prior to induction Oxygen Delivery Method: Circle system utilized Preoxygenation: Pre-oxygenation with 100% oxygen Induction Type: IV induction LMA: LMA inserted LMA Size: 3.0 Tube type: Oral Number of attempts: 1 Placement Confirmation: positive ETCO2 and breath sounds checked- equal and bilateral Tube secured with: Tape Dental Injury: Teeth and Oropharynx as per pre-operative assessment

## 2023-07-22 NOTE — Anesthesia Preprocedure Evaluation (Signed)
Anesthesia Evaluation  Patient identified by MRN, date of birth, ID band Patient awake    Reviewed: Allergy & Precautions, H&P , NPO status , Patient's Chart, lab work & pertinent test results  Airway Mallampati: II  TM Distance: >3 FB Neck ROM: Full    Dental no notable dental hx.    Pulmonary neg pulmonary ROS   Pulmonary exam normal breath sounds clear to auscultation       Cardiovascular negative cardio ROS Normal cardiovascular exam Rhythm:Regular Rate:Normal     Neuro/Psych negative neurological ROS  negative psych ROS   GI/Hepatic negative GI ROS, Neg liver ROS,,,  Endo/Other  negative endocrine ROS    Renal/GU negative Renal ROS  negative genitourinary   Musculoskeletal negative musculoskeletal ROS (+)    Abdominal   Peds negative pediatric ROS (+)  Hematology negative hematology ROS (+) Hb 12.4, plt 187   Anesthesia Other Findings   Reproductive/Obstetrics negative OB ROS                             Anesthesia Physical Anesthesia Plan  ASA: 2 and emergent  Anesthesia Plan: General   Post-op Pain Management: Tylenol PO (pre-op)*   Induction: Intravenous  PONV Risk Score and Plan: 3 and Ondansetron, Dexamethasone, Midazolam and Treatment may vary due to age or medical condition  Airway Management Planned: LMA  Additional Equipment: None  Intra-op Plan:   Post-operative Plan:   Informed Consent: I have reviewed the patients History and Physical, chart, labs and discussed the procedure including the risks, benefits and alternatives for the proposed anesthesia with the patient or authorized representative who has indicated his/her understanding and acceptance.       Plan Discussed with:   Anesthesia Plan Comments:         Anesthesia Quick Evaluation
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
## 2023-07-22 NOTE — Transfer of Care (Signed)
Immediate Anesthesia Transfer of Care Note  Patient: Andrea Vaughn  Procedure(s) Performed: IRRIGATION AND DEBRIDEMENT RIGHT HAND (Right: Hand)  Patient Location: PACU  Anesthesia Type:General  Level of Consciousness: awake, alert , oriented, and patient cooperative  Airway & Oxygen Therapy: Patient Spontanous Breathing and Patient connected to face mask oxygen  Post-op Assessment: Report given to RN and Post -op Vital signs reviewed and stable  Post vital signs: Reviewed and stable  Last Vitals:  Vitals Value Taken Time  BP 111/72 07/22/23 1814  Temp 36.6 C 07/22/23 1814  Pulse 57 07/22/23 1814  Resp 11 07/22/23 1814  SpO2 100 % 07/22/23 1814  Vitals shown include unfiled device data.  Last Pain:  Vitals:   07/22/23 1328  TempSrc:   PainSc: 7          Complications: No notable events documented.

## 2023-07-23 ENCOUNTER — Encounter (HOSPITAL_COMMUNITY): Payer: Self-pay | Admitting: Orthopedic Surgery

## 2023-07-28 LAB — AEROBIC/ANAEROBIC CULTURE W GRAM STAIN (SURGICAL/DEEP WOUND): Gram Stain: NONE SEEN

## 2023-08-27 ENCOUNTER — Other Ambulatory Visit: Payer: Self-pay

## 2023-08-27 ENCOUNTER — Encounter (HOSPITAL_COMMUNITY): Payer: Self-pay | Admitting: *Deleted

## 2023-08-27 ENCOUNTER — Emergency Department (HOSPITAL_COMMUNITY): Payer: Medicaid Other

## 2023-08-27 ENCOUNTER — Emergency Department (HOSPITAL_COMMUNITY)
Admission: EM | Admit: 2023-08-27 | Discharge: 2023-08-27 | Discharge status: Against Medical Advice | Payer: Medicaid Other | Attending: Emergency Medicine | Admitting: Emergency Medicine

## 2023-08-27 DIAGNOSIS — Z5321 Procedure and treatment not carried out due to patient leaving prior to being seen by health care provider: Secondary | ICD-10-CM | POA: Insufficient documentation

## 2023-08-27 DIAGNOSIS — R2231 Localized swelling, mass and lump, right upper limb: Secondary | ICD-10-CM | POA: Insufficient documentation

## 2023-08-27 NOTE — ED Notes (Signed)
Patient given bandage for hand per request.  Patient requesting to leave.  Patient advised that she needs to stay for results of x-ray.  Patient verbalized understanding.
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
## 2023-08-30 ENCOUNTER — Emergency Department (HOSPITAL_BASED_OUTPATIENT_CLINIC_OR_DEPARTMENT_OTHER)
Admission: EM | Admit: 2023-08-30 | Discharge: 2023-08-30 | Disposition: A | Discharge status: Home / Self Care | Payer: Medicaid Other

## 2023-08-30 ENCOUNTER — Emergency Department (HOSPITAL_BASED_OUTPATIENT_CLINIC_OR_DEPARTMENT_OTHER): Payer: Medicaid Other | Admitting: Radiology

## 2023-08-30 ENCOUNTER — Other Ambulatory Visit: Payer: Self-pay

## 2023-08-30 ENCOUNTER — Encounter (HOSPITAL_BASED_OUTPATIENT_CLINIC_OR_DEPARTMENT_OTHER): Payer: Self-pay | Admitting: Emergency Medicine

## 2023-08-30 DIAGNOSIS — S62344D Nondisplaced fracture of base of fourth metacarpal bone, right hand, subsequent encounter for fracture with routine healing: Secondary | ICD-10-CM | POA: Insufficient documentation

## 2023-08-30 DIAGNOSIS — S6991XD Unspecified injury of right wrist, hand and finger(s), subsequent encounter: Secondary | ICD-10-CM | POA: Diagnosis present

## 2023-08-30 DIAGNOSIS — S61459A Open bite of unspecified hand, initial encounter: Secondary | ICD-10-CM

## 2023-08-30 DIAGNOSIS — S6991XA Unspecified injury of right wrist, hand and finger(s), initial encounter: Secondary | ICD-10-CM

## 2023-08-30 MED ORDER — SULFAMETHOXAZOLE-TRIMETHOPRIM 800-160 MG PO TABS
1.0000 | ORAL_TABLET | Freq: Once | ORAL | Status: AC
  Administered 2023-08-30: 1 via ORAL
  Filled 2023-08-30: qty 1

## 2023-08-30 MED ORDER — METRONIDAZOLE 500 MG PO TABS
500.0000 mg | ORAL_TABLET | Freq: Once | ORAL | Status: AC
  Administered 2023-08-30: 500 mg via ORAL
  Filled 2023-08-30: qty 1

## 2023-08-30 MED ORDER — HYDROCODONE-ACETAMINOPHEN 5-325 MG PO TABS
1.0000 | ORAL_TABLET | Freq: Once | ORAL | Status: AC
  Administered 2023-08-30: 1 via ORAL
  Filled 2023-08-30: qty 1

## 2023-08-30 MED ORDER — METRONIDAZOLE 500 MG PO TABS: 500.0000 mg | ORAL_TABLET | Freq: Three times a day (TID) | ORAL | 0 refills | Status: AC

## 2023-08-30 MED ORDER — OXYCODONE-ACETAMINOPHEN 5-325 MG PO TABS
1.0000 | ORAL_TABLET | Freq: Once | ORAL | Status: AC
  Administered 2023-08-30: 1 via ORAL
  Filled 2023-08-30: qty 1

## 2023-08-30 MED ORDER — OXYCODONE HCL 5 MG PO TABS
5.0000 mg | ORAL_TABLET | Freq: Four times a day (QID) | ORAL | 0 refills | Status: AC | PRN
Start: 2023-08-30 — End: 2023-09-02

## 2023-08-30 MED ORDER — DOXYCYCLINE HYCLATE 100 MG PO CAPS: 100.0000 mg | ORAL_CAPSULE | Freq: Two times a day (BID) | ORAL | 0 refills | Status: AC

## 2023-08-30 NOTE — Discharge Instructions (Addendum)
As discussed, call [facility] tomorrow and tell them you need a same day appointment with Dr. Yehuda Budd for a hospital follow up. We spoke with the on-call doctor for [facility] who advised a next day appointment.  Take metronidazole 3 times a day plus doxycycline twice a day for the next 7 days to prevent infection. I have also sent in a prescription for Oxycodone. You can take this up to 4 times a day for pain.  Do not drive or operate heavy machinery while taking oxycodone, as it can cause drowsiness.  Alternate between Ibuprofen and Tylenol every 4 hours for pain.  You can take oxycodone for breakthrough pain.  Return to the ED if your symptoms worsen in the interim.

## 2023-08-30 NOTE — ED Triage Notes (Signed)
Pt had xray 3 days ago and was negative
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
## 2024-01-21 IMAGING — US US MFM OB FOLLOW-UP
1 series · 13 of 28 positions shown · non-contrast
Comparison: none

[Series 1: us mfm ob follow-up · 58 acquisitions, 13 frames shown]
[im 3/58]
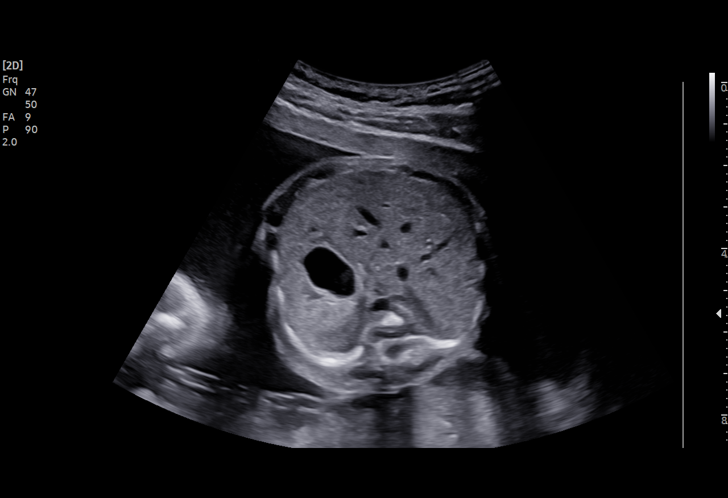
[im 7/58]
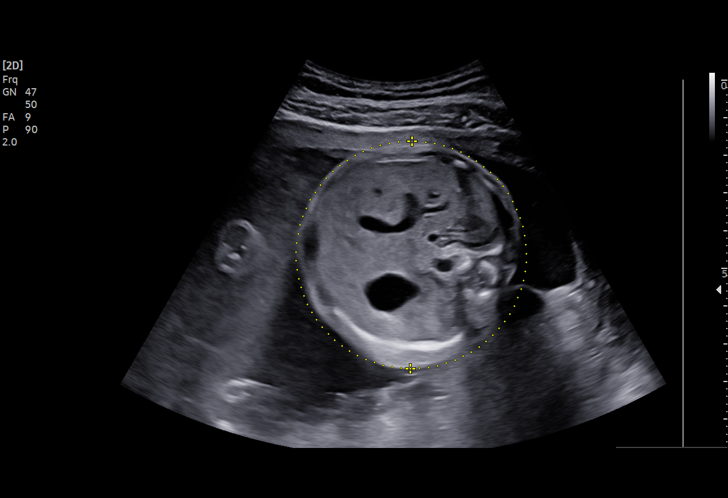
[im 11/58]
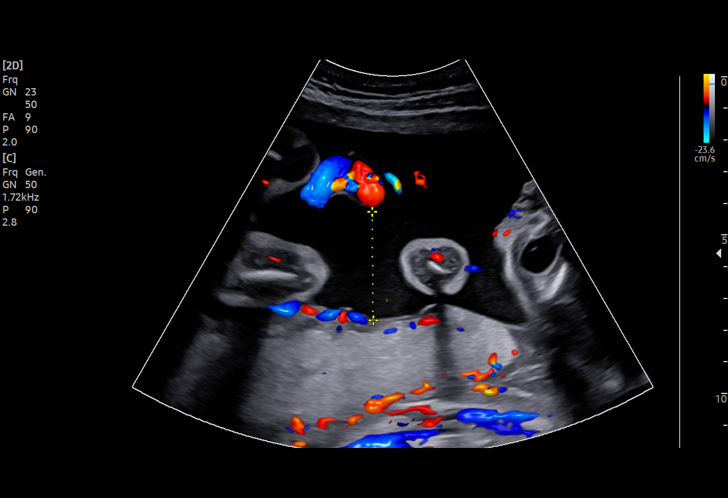
[im 15/58]
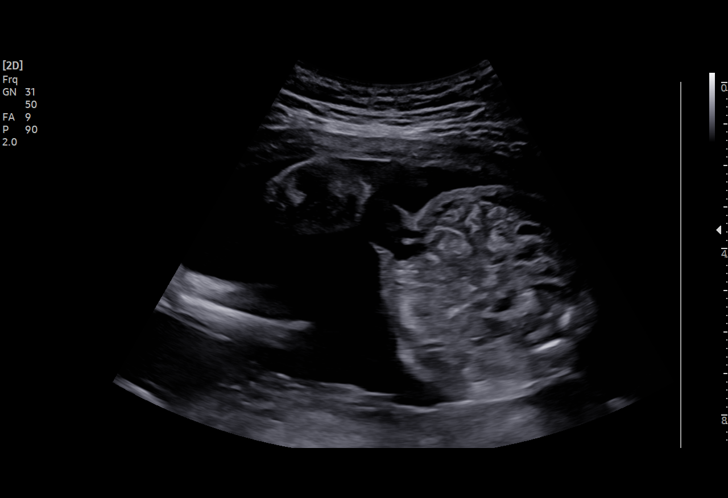
[im 20/58]
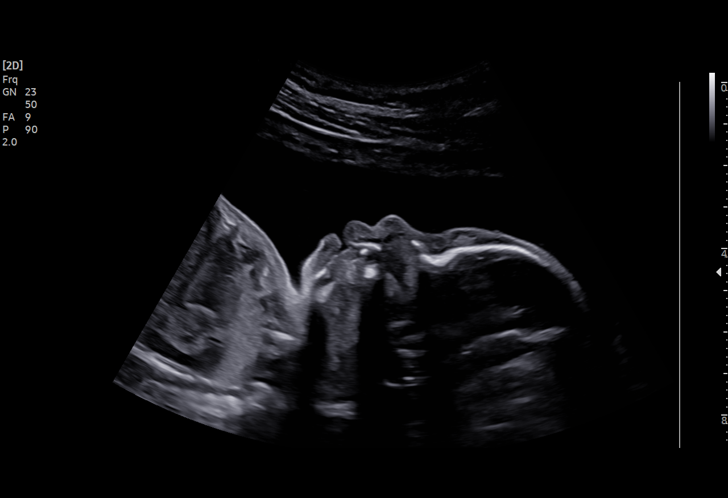
[im 24/58]
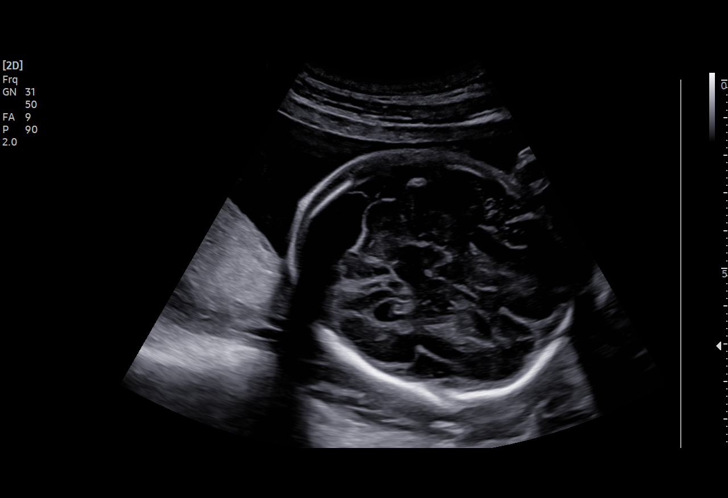
[im 30/58]
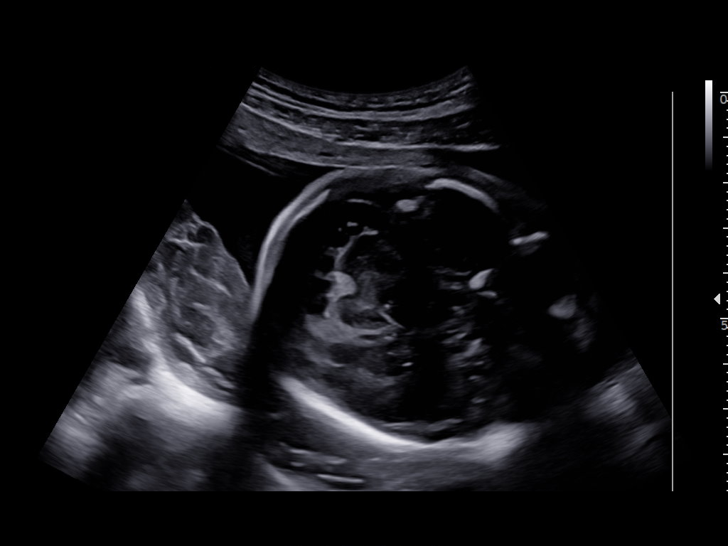
[im 34/58]
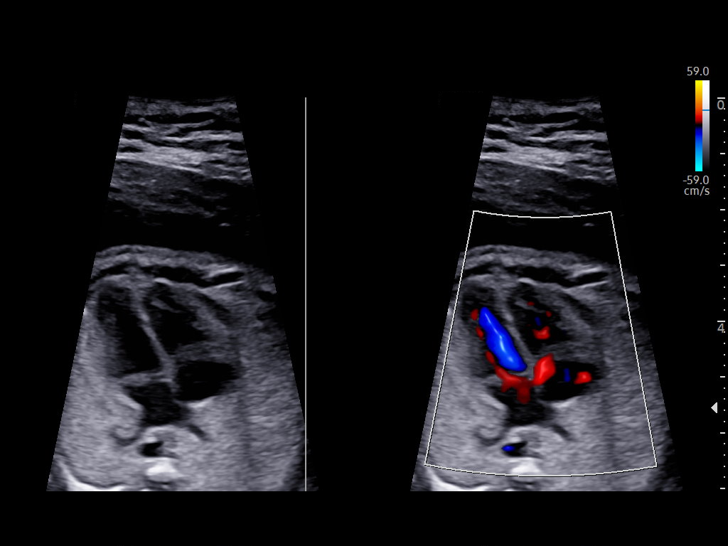
[im 39/58]
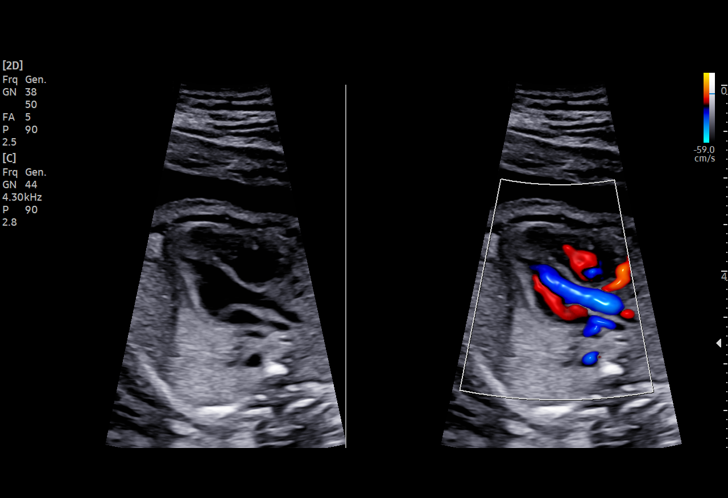
[im 43/58]
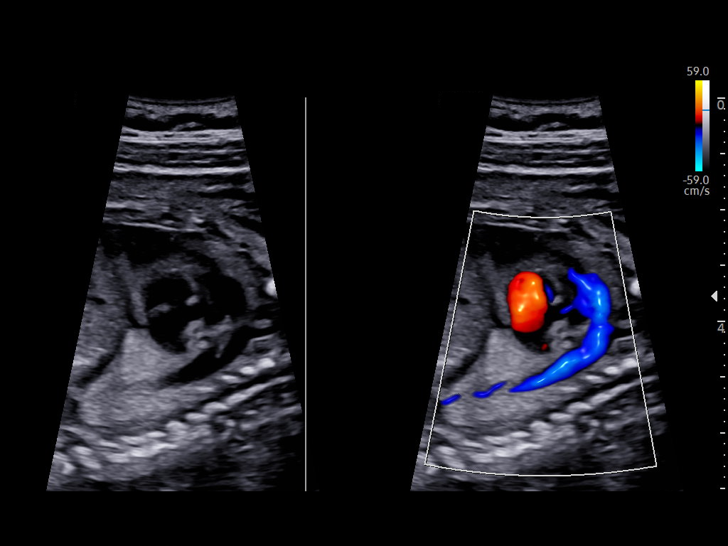
[im 47/58]
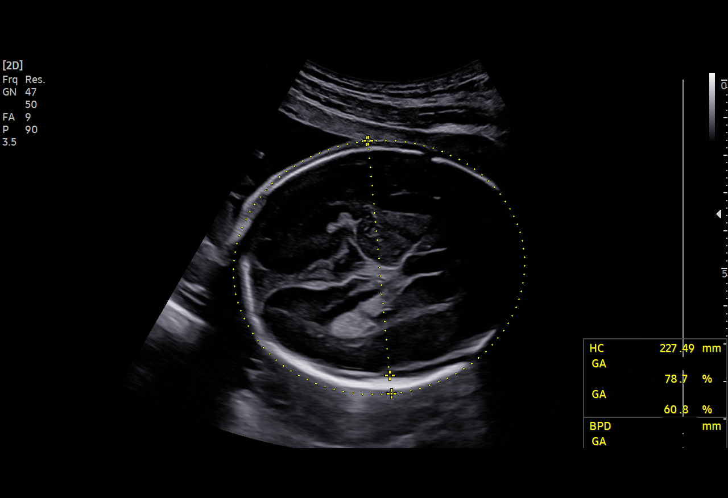
[im 51/58]
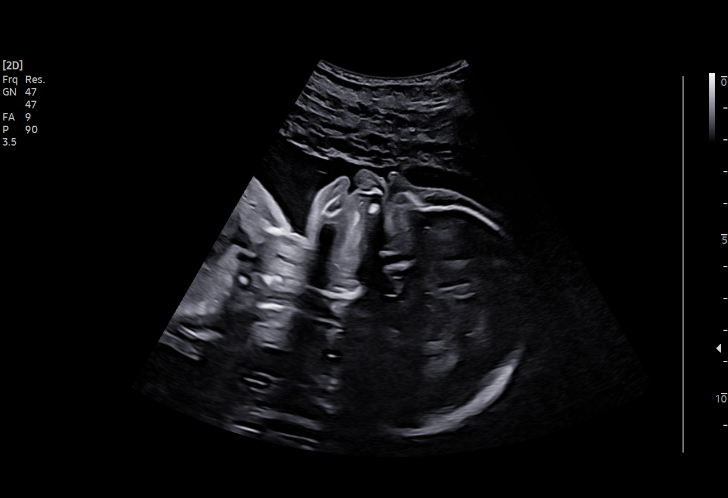
[im 55/58]
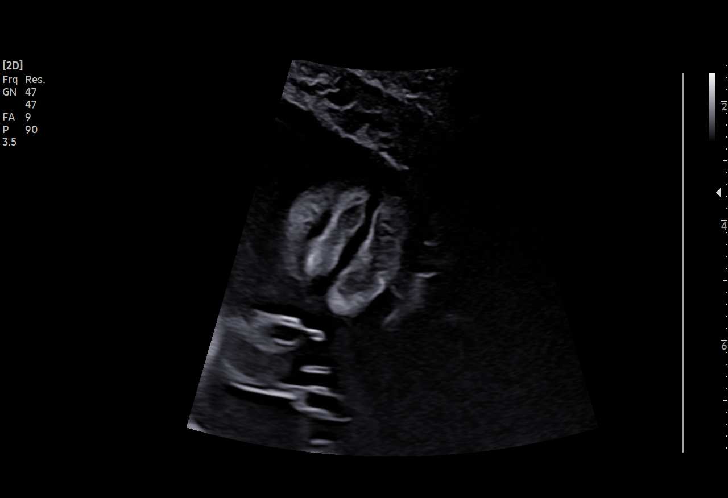

[13 of 28 positions shown; findings below may reference images not displayed]

[place]
                                                            [address]

Indications

 Abnormal fetal ultrasound (enlarged cisterna
 magna)
 23 weeks gestation of pregnancy
 Encounter for other antenatal screening
 follow-up
 LR NIPS/Negative Horizon
Fetal Evaluation

 Num Of Fetuses:         1
 Fetal Heart Rate(bpm):  153
 Cardiac Activity:       Observed
 Presentation:           Cephalic
 Placenta:               Posterior
 P. Cord Insertion:      Previously Visualized

 Amniotic Fluid
 AFI FV:      Within normal limits

                             Largest Pocket(cm)

Biometry
 BPD:     61.11  mm     G. Age:  24w 6d         86  %    CI:        73.03   %    70 - 86
                                                         FL/HC:      18.4   %    18.7 -
 HC:    227.31   mm     G. Age:  24w 5d         78  %    HC/AC:      1.20        1.05 -
 AC:    189.88   mm     G. Age:  23w 5d         47  %    FL/BPD:     68.5   %    71 - 87
 FL:      41.86  mm     G. Age:  23w 4d         39  %    FL/AC:      22.0   %    20 - 24
 CER:      26.2  mm     G. Age:  23w 4d         70  %

 LV:          9  mm
 CM:         12  mm

 Est. FW:     635  gm      1 lb 6 oz     55  %
OB History

 Blood Type:   AB+
 Gravidity:    2          SAB:   1
 Living:       0
Gestational Age

 LMP:           23w 4d        Date:  11/08/21                 EDD:   08/15/22
 U/S Today:     24w 2d                                        EDD:   08/10/22
 Best:          23w 4d     Det. By:  LMP  (11/08/21)          EDD:   08/15/22
Anatomy

 Cranium:               Appears normal         Aortic Arch:            Appears normal
 Cavum:                 Previously seen        Ductal Arch:            Appears normal
 Ventricles:            Appears normal         Diaphragm:              Appears normal
 Choroid Plexus:        Previously seen        Stomach:                Appears normal, left
                                                                       sided
 Cerebellum:            Previously seen        Abdomen:                Appears normal
 Posterior Fossa:       Previously seen        Abdominal Wall:         Previously seen
 Nuchal Fold:           Not applicable (>20    Cord Vessels:           Previously seen
                        wks GA)
 Face:                  Orbits and profile     Kidneys:                Appear normal
                        previously seen
 Lips:                  Previously seen        Bladder:                Appears normal
 Thoracic:              Appears normal         Spine:                  Previously seen
 Heart:                 Appears normal         Upper Extremities:      Previously seen
                        (4CH, axis, and
                        situs)
 RVOT:                  Appears normal         Lower Extremities:      Previously seen
 LVOT:                  Appears normal

 Other:  Fetus appears to be a male. VC, 3VV and 3VTV visualized.
         Heels/feet and open hands/5th digits visualized. previously
Impression
 Patient return for fetal growth assessment.  On previous
 ultrasound, posterior fossa cyst was suspected.
 On today's ultrasound, amniotic fluid is normal and good fetal
 activity seen.  Fetal growth is appropriate for gestational age.
 Cerebellum and vermis appear normal.  Cisterna magna is
 slightly enlarged (12 mm).  No other intracranial abnormalities
 were seen.
 I reassured the patient that isolated enlarged cisterna magna
 is not associated with adverse outcomes.
Recommendations

 -An appointment was made for her to return in 8 weeks for
 fetal growth assessment.
                Tatiana, Su Jeong

## 2024-02-04 ENCOUNTER — Other Ambulatory Visit: Payer: Self-pay

## 2024-02-04 ENCOUNTER — Emergency Department (HOSPITAL_COMMUNITY)
Admission: EM | Admit: 2024-02-04 | Discharge: 2024-02-04 | Disposition: A | Discharge status: Home / Self Care | Attending: Emergency Medicine | Admitting: Emergency Medicine

## 2024-02-04 ENCOUNTER — Encounter (HOSPITAL_COMMUNITY): Payer: Self-pay | Admitting: Emergency Medicine

## 2024-02-04 ENCOUNTER — Emergency Department (HOSPITAL_COMMUNITY)

## 2024-02-04 DIAGNOSIS — B9689 Other specified bacterial agents as the cause of diseases classified elsewhere: Secondary | ICD-10-CM

## 2024-02-04 DIAGNOSIS — R1031 Right lower quadrant pain: Secondary | ICD-10-CM | POA: Diagnosis present

## 2024-02-04 LAB — CBC
HCT: 40.8 % (ref 36.0–46.0)
Hemoglobin: 13.4 g/dL (ref 12.0–15.0)
MCH: 29.9 pg (ref 26.0–34.0)
MCHC: 32.8 g/dL (ref 30.0–36.0)
MCV: 91.1 fL (ref 80.0–100.0)
Platelets: 235 10*3/uL (ref 150–400)
RBC: 4.48 MIL/uL (ref 3.87–5.11)
RDW: 13.1 % (ref 11.5–15.5)
WBC: 8.7 10*3/uL (ref 4.0–10.5)
nRBC: 0 % (ref 0.0–0.2)

## 2024-02-04 LAB — COMPREHENSIVE METABOLIC PANEL
ALT: 19 U/L (ref 0–44)
AST: 19 U/L (ref 15–41)
Albumin: 4.3 g/dL (ref 3.5–5.0)
Alkaline Phosphatase: 37 U/L — ABNORMAL LOW (ref 38–126)
Anion gap: 9 (ref 5–15)
BUN: 11 mg/dL (ref 6–20)
CO2: 25 mmol/L (ref 22–32)
Calcium: 9.4 mg/dL (ref 8.9–10.3)
Chloride: 102 mmol/L (ref 98–111)
Creatinine, Ser: 0.7 mg/dL (ref 0.44–1.00)
GFR, Estimated: >60 mL/min (ref >60)
Glucose, Bld: 89 mg/dL (ref 70–99)
Potassium: 3.7 mmol/L (ref 3.5–5.1)
Sodium: 136 mmol/L (ref 135–145)
Total Bilirubin: 0.8 mg/dL (ref 0.0–1.2)
Total Protein: 7.4 g/dL (ref 6.5–8.1)

## 2024-02-04 LAB — LIPASE, BLOOD: Lipase: 27 U/L (ref 11–51)

## 2024-02-04 LAB — URINALYSIS, ROUTINE W REFLEX MICROSCOPIC
Bilirubin Urine: NEGATIVE
Glucose, UA: NEGATIVE mg/dL
Hgb urine dipstick: NEGATIVE
Ketones, ur: NEGATIVE mg/dL
Leukocytes,Ua: NEGATIVE
Nitrite: NEGATIVE
Protein, ur: NEGATIVE mg/dL
Specific Gravity, Urine: >1.046 — ABNORMAL HIGH (ref 1.005–1.030)
pH: 8 (ref 5.0–8.0)

## 2024-02-04 LAB — WET PREP, GENITAL
Sperm: NONE SEEN
Trich, Wet Prep: NONE SEEN
WBC, Wet Prep HPF POC: >=10 — AB (ref <10)
Yeast Wet Prep HPF POC: NONE SEEN

## 2024-02-04 LAB — HCG, SERUM, QUALITATIVE: Preg, Serum: NEGATIVE

## 2024-02-04 MED ORDER — METRONIDAZOLE 500 MG PO TABS
500.0000 mg | ORAL_TABLET | Freq: Two times a day (BID) | ORAL | 0 refills | Status: AC
Start: 2024-02-04 — End: ?

## 2024-02-04 MED ORDER — IOHEXOL 300 MG/ML  SOLN
100.0000 mL | Freq: Once | INTRAMUSCULAR | Status: AC | PRN
  Administered 2024-02-04: 75 mL via INTRAVENOUS

## 2024-02-04 MED ORDER — FENTANYL CITRATE PF 50 MCG/ML IJ SOSY
50.0000 ug | PREFILLED_SYRINGE | Freq: Once | INTRAMUSCULAR | Status: AC
  Administered 2024-02-04: 50 ug via INTRAVENOUS
  Filled 2024-02-04: qty 1

## 2024-02-04 NOTE — ED Provider Notes (Signed)
 Received patient from previous provider pending CT, [place], labs, completion of ED workup.  See her note  In short, patient presents to emergency department for evaluation of RLQ abdominal pain started this morning at 0800.  She was initially seen in urgent care and sent here to rule out appendicitis.  She has no other complaints of fever, nausea, vomiting, diarrhea, vaginal symptoms, nor urinary symptoms.  Vital signs WNL with no hypotension, tachycardia, nor fever.  CT without etiology of RLQ pain, normal pelvic ultrasound.  Labs are all WNL with no gross abnormalities.  Completed pelvic exam.  External anatomy without abnormalities.  No blood in vaginal vault.  Some cloudy discharge and curd-like discharge noted.  No CMT.  Wet prep notable for BV.  Sent [facility] prescription.  GC chlamydia pending  At this time, I do not see an emergent cause of patient's symptoms.Discussed ED workup, disposition, return to ED precautions with patient who expresses understanding agrees with plan.  All questions answered to their satisfaction.  They are agreeable to plan.  Discharge instructions provided on paperwork   Judithann Sheen, PA 02/04/24 1819    Glyn Ade, MD 02/08/24 [phone]

## 2024-02-04 NOTE — ED Provider Notes (Signed)
 [place] EMERGENCY DEPARTMENT AT [facility] Provider Note   CSN: [id number] Arrival date & time: 02/04/24  1245     History Chief Complaint  Patient presents with   Abdominal Pain    Andrea Vaughn is a 23 y.o. female self reportedly otherwise healthy presents the emergency department today for evaluation of right lower quadrant pain since this morning around 0800.  Was seen in urgent care and sent over here for further evaluation to rule out appendicitis.  She denies any fever, nausea, vomiting, diarrhea, constipation, melena, hematochezia, dysuria, hematuria, urinary urgency/frequency, vaginal discharge, or vaginal bleeding.  Reports her last bowel movement was this morning and was unremarkable.  No medications taken prior to arrival.  Had a C-section for abdominal surgery however no others.  Allergic to penicillin.  Occasional marijuana use but denies any tobacco or EtOH use.   Abdominal Pain Associated symptoms: no chills, no constipation, no diarrhea, no dysuria, no fever, no hematuria, no nausea, no shortness of breath, no vaginal bleeding, no vaginal discharge and no vomiting        Home Medications Prior to Admission medications   Medication Sig Start Date End Date Taking? Authorizing Provider  cetirizine (ZYRTEC) 10 MG tablet Take 10 mg by mouth daily as needed for allergies.  10/03/20  [provider]  methylphenidate (METADATE CD) 10 MG CR capsule Take 10 mg by mouth every morning.  10/03/20  [provider]      Allergies    Penicillins    Review of Systems   Review of Systems  Constitutional:  Negative for chills and fever.  Respiratory:  Negative for shortness of breath.   Gastrointestinal:  Positive for abdominal pain. Negative for blood in stool, constipation, diarrhea, nausea and vomiting.  Genitourinary:  Negative for dysuria, flank pain, hematuria, pelvic pain, urgency, vaginal bleeding and vaginal discharge.    Physical  Exam Updated Vital Signs BP 102/66 (BP Location: Right Arm)   Pulse 73   Temp 98.8 F (37.1 C) (Oral)   Resp 18   Ht 5\' 4"  (1.626 m)   Wt 58.5 kg   LMP 01/27/2024 (Exact Date)   SpO2 100%   Breastfeeding No   BMI 22.14 kg/m  Physical Exam Vitals and nursing note reviewed.  Constitutional:      General: She is not in acute distress.    Appearance: She is not ill-appearing or toxic-appearing.  Eyes:     General: No scleral icterus. Cardiovascular:     Rate and Rhythm: Normal rate.  Pulmonary:     Effort: Pulmonary effort is normal. No respiratory distress.  Abdominal:     General: Bowel sounds are normal. There is no distension.     Palpations: Abdomen is soft.     Tenderness: There is abdominal tenderness in the right lower quadrant and suprapubic area. There is no guarding or rebound.  Skin:    General: Skin is warm and dry.  Neurological:     Mental Status: She is alert.     ED Results / Procedures / Treatments   Labs (all labs ordered are listed, but only abnormal results are displayed) Labs Reviewed  COMPREHENSIVE METABOLIC PANEL - Abnormal; Notable for the following components:      Result Value   Alkaline Phosphatase 37 (*)    All other components within normal limits  LIPASE, BLOOD  CBC  URINALYSIS, ROUTINE W REFLEX MICROSCOPIC  HCG, SERUM, QUALITATIVE    EKG None  Radiology No results  found.  Procedures Procedures   Medications Ordered in ED Medications - No data to display  ED Course/ Medical Decision Making/ A&P                               Medical Decision Making Amount and/or Complexity of Data Reviewed Labs: ordered. Radiology: ordered.  Risk Prescription drug management.   23 y.o. female presents to the ER for evaluation of lower abdominal pain. Differential diagnosis includes but is not limited to AAA, mesenteric ischemia, appendicitis, diverticulitis, DKA, gastroenteritis, nephrolithiasis, pancreatitis, constipation, UTI, bowel  obstruction, biliary disease, IBD, PUD, hepatitis, ectopic pregnancy, ovarian torsion, PID. Vital signs unremarkable. Physical exam as noted above.   Labs ordered in triage.  I have added on a CT scan.  She does not appear in any acute distress.  Lying on stretcher and appears comfortable.  She does have some right lower quadrant and suprapubic tenderness.  Abdomen is soft.  Vital signs within normal limits and are unremarkable.  I independently reviewed and interpreted the patient's labs.  CBC without leukocytosis or anemia.  CMP shows mildly decreased alk phos otherwise no other electrolyte or LFT abnormality.  Lipase is normal limits.  hCG negative.  Urinalysis pending.  3:19 PM Care of Andrea Vaughn  transferred to PA Sabra Heck at the end of my shift as the patient will require reassessment once labs/imaging have resulted. Patient presentation, ED course, and plan of care discussed with review of all pertinent labs and imaging. Please see his/her note for further details regarding further ED course and disposition. Plan at time of handoff is follow up with urinalysis and CT imaging. If CT imaging unremarkable, would proceed with pelvic examination and [place]. This may be altered or completely changed at the discretion of the oncoming team pending results of further workup.  Portions of this report may have been transcribed using voice recognition software. Every effort was made to ensure accuracy; however, inadvertent computerized transcription errors may be present.   Final Clinical Impression(s) / ED Diagnoses Final diagnoses:  None    Rx / DC Orders ED Discharge Orders     None         Achille Rich, PA-C 02/04/24 1523    Vanetta Mulders, MD 02/05/24 2015

## 2024-02-04 NOTE — Discharge Instructions (Addendum)
 Thank you for letting us evaluate you today.  You tested positive for bacterial vaginosis.  This results from disruption of bacterial flora in vagina and is not a STD.  I have sent a prescription to your pharmacy to take to cure this. Your STD panel is pending and will be available on your mychart in 24-48hrs  Otherwise as we discussed your ED workup is unremarkable.  Your CT did not show any etiology of why you are having right lower quadrant pain.  Your appendix is normal.  Your ultrasound did not show any ovarian or gynecological pathologies for symptoms.  Please follow-up with primary care provider for follow-up and further management  Return to ED if significant worsening of symptoms

## 2024-02-04 NOTE — ED Triage Notes (Signed)
 Pt c/o abdominal pain. Pt sent from urgent care to r/o appendicitis. Denies N/V/D.

## 2024-02-05 LAB — GC/CHLAMYDIA PROBE AMP (~~LOC~~) NOT AT ARMC
Chlamydia: NEGATIVE
Comment: NEGATIVE
Comment: NORMAL
Neisseria Gonorrhea: NEGATIVE
